# Patient Record
Sex: Female | Born: 1945 | Race: White | Hispanic: No | Smoking: Never smoker
Health system: Southern US, Community
[De-identification: ages and names within clinical notes are randomized; demographics above are authoritative.]

## PROBLEM LIST (undated history)

## (undated) DIAGNOSIS — I10 Essential (primary) hypertension: Secondary | ICD-10-CM

## (undated) HISTORY — PX: REPLACEMENT TOTAL KNEE BILATERAL: SUR1225

## (undated) HISTORY — PX: CATARACT EXTRACTION: SUR2

---

## 2016-08-14 ENCOUNTER — Emergency Department
Admission: EM | Admit: 2016-08-14 | Discharge: 2016-08-14 | Discharge status: Absent without leave | Payer: Medicare Other

## 2016-08-14 ENCOUNTER — Ambulatory Visit (INDEPENDENT_AMBULATORY_CARE_PROVIDER_SITE_OTHER)
Admission: EM | Admit: 2016-08-14 | Discharge: 2016-08-14 | Disposition: A | Discharge status: Home / Self Care | Payer: Medicare Other | Source: Home / Self Care | Attending: Family Medicine | Admitting: Family Medicine

## 2016-08-14 ENCOUNTER — Encounter: Payer: Self-pay | Admitting: Emergency Medicine

## 2016-08-14 ENCOUNTER — Ambulatory Visit: Payer: Medicare Other

## 2016-08-14 DIAGNOSIS — I082 Rheumatic disorders of both aortic and tricuspid valves: Secondary | ICD-10-CM | POA: Diagnosis not present

## 2016-08-14 DIAGNOSIS — I119 Hypertensive heart disease without heart failure: Secondary | ICD-10-CM | POA: Diagnosis not present

## 2016-08-14 DIAGNOSIS — Z91013 Allergy to seafood: Secondary | ICD-10-CM | POA: Insufficient documentation

## 2016-08-14 DIAGNOSIS — Z6841 Body Mass Index (BMI) 40.0 and over, adult: Secondary | ICD-10-CM | POA: Diagnosis not present

## 2016-08-14 DIAGNOSIS — J101 Influenza due to other identified influenza virus with other respiratory manifestations: Principal | ICD-10-CM | POA: Insufficient documentation

## 2016-08-14 DIAGNOSIS — E876 Hypokalemia: Secondary | ICD-10-CM | POA: Insufficient documentation

## 2016-08-14 DIAGNOSIS — M7989 Other specified soft tissue disorders: Secondary | ICD-10-CM | POA: Diagnosis not present

## 2016-08-14 DIAGNOSIS — R0602 Shortness of breath: Secondary | ICD-10-CM | POA: Diagnosis present

## 2016-08-14 DIAGNOSIS — R0902 Hypoxemia: Secondary | ICD-10-CM | POA: Insufficient documentation

## 2016-08-14 DIAGNOSIS — E669 Obesity, unspecified: Secondary | ICD-10-CM | POA: Insufficient documentation

## 2016-08-14 DIAGNOSIS — Z96653 Presence of artificial knee joint, bilateral: Secondary | ICD-10-CM | POA: Diagnosis not present

## 2016-08-14 DIAGNOSIS — R0689 Other abnormalities of breathing: Secondary | ICD-10-CM

## 2016-08-14 DIAGNOSIS — J209 Acute bronchitis, unspecified: Secondary | ICD-10-CM | POA: Insufficient documentation

## 2016-08-14 HISTORY — DX: Essential (primary) hypertension: I10

## 2016-08-14 LAB — URINALYSIS, COMPLETE (UACMP) WITH MICROSCOPIC
Bacteria, UA: NONE SEEN
Bilirubin Urine: NEGATIVE
Glucose, UA: NEGATIVE mg/dL
Ketones, ur: NEGATIVE mg/dL
Leukocytes, UA: NEGATIVE
Nitrite: NEGATIVE
Protein, ur: NEGATIVE mg/dL
SPECIFIC GRAVITY, URINE: 1.02 (ref 1.005–1.030)
WBC UA: NONE SEEN WBC/hpf (ref 0–5)
pH: 5.5 (ref 5.0–8.0)

## 2016-08-14 LAB — CBC WITH DIFFERENTIAL/PLATELET
Basophils Absolute: 0 10*3/uL (ref 0–0.1)
Basophils Relative: 1 %
EOS PCT: 0 %
Eosinophils Absolute: 0 10*3/uL (ref 0–0.7)
HCT: 36.4 % (ref 35.0–47.0)
HEMOGLOBIN: 12.1 g/dL (ref 12.0–16.0)
LYMPHS ABS: 0.6 10*3/uL — AB (ref 1.0–3.6)
LYMPHS PCT: 11 %
MCH: 27.4 pg (ref 26.0–34.0)
MCHC: 33.1 g/dL (ref 32.0–36.0)
MCV: 82.8 fL (ref 80.0–100.0)
Monocytes Absolute: 0.2 10*3/uL (ref 0.2–0.9)
Monocytes Relative: 4 %
Neutro Abs: 5.1 10*3/uL (ref 1.4–6.5)
Neutrophils Relative %: 84 %
PLATELETS: 193 10*3/uL (ref 150–440)
RBC: 4.4 MIL/uL (ref 3.80–5.20)
RDW: 13.9 % (ref 11.5–14.5)
WBC: 6 10*3/uL (ref 3.6–11.0)

## 2016-08-14 LAB — BASIC METABOLIC PANEL
Anion gap: 10 (ref 5–15)
BUN: 15 mg/dL (ref 6–20)
CHLORIDE: 94 mmol/L — AB (ref 101–111)
CO2: 27 mmol/L (ref 22–32)
Calcium: 8.7 mg/dL — ABNORMAL LOW (ref 8.9–10.3)
Creatinine, Ser: 0.69 mg/dL (ref 0.44–1.00)
GFR calc Af Amer: >60 mL/min (ref >60)
GFR calc non Af Amer: >60 mL/min (ref >60)
GLUCOSE: 185 mg/dL — AB (ref 65–99)
POTASSIUM: 3 mmol/L — AB (ref 3.5–5.1)
Sodium: 131 mmol/L — ABNORMAL LOW (ref 135–145)

## 2016-08-14 LAB — RAPID INFLUENZA A&B ANTIGENS (ARMC ONLY)
INFLUENZA A (ARMC): POSITIVE — AB
INFLUENZA B (ARMC): NEGATIVE

## 2016-08-14 LAB — RAPID STREP SCREEN (MED CTR MEBANE ONLY): STREPTOCOCCUS, GROUP A SCREEN (DIRECT): NEGATIVE

## 2016-08-14 MED ORDER — PREDNISONE 50 MG PO TABS
60.0000 mg | ORAL_TABLET | Freq: Once | ORAL | Status: AC
  Administered 2016-08-14: 20:00:00 60 mg via ORAL

## 2016-08-14 MED ORDER — IPRATROPIUM-ALBUTEROL 0.5-2.5 (3) MG/3ML IN SOLN
3.0000 mL | Freq: Four times a day (QID) | RESPIRATORY_TRACT | Status: DC
  Administered 2016-08-14: 3 mL via RESPIRATORY_TRACT

## 2016-08-14 MED ORDER — IPRATROPIUM-ALBUTEROL 0.5-2.5 (3) MG/3ML IN SOLN
3.0000 mL | Freq: Once | RESPIRATORY_TRACT | Status: AC
  Administered 2016-08-14: 3 mL via RESPIRATORY_TRACT

## 2016-08-14 MED ORDER — ACETAMINOPHEN 325 MG PO TABS
650.0000 mg | ORAL_TABLET | Freq: Once | ORAL | Status: AC
  Administered 2016-08-14: 650 mg via ORAL

## 2016-08-14 NOTE — ED Notes (Signed)
Ambulatory O2 sat 84% -86%

## 2016-08-14 NOTE — ED Triage Notes (Signed)
Patient c/o cough, chest congestion, runny nose, bodyaches for the past 3 days.

## 2016-08-14 NOTE — ED Notes (Signed)
Discharged to go directly to emergency dept at [facility].

## 2016-08-14 NOTE — ED Provider Notes (Signed)
[facility] ____________________________________________  Time seen: Approximately 6:40 PM  I have reviewed the triage vital signs and the nursing notes.   HISTORY  Chief Complaint Cough and Generalized Body Aches   HPI Tara Moody is a 71 y.o. female  presenting for the complaints of 2 days of chills, body aches, increased nasal congestion, increased cough and fevers. She reports has intermittently been taking over-the-counter Tylenol, last dose taken this morning with over-the-counter Mucinex. Patient reports her sister recently sick with similar complaints. Denies other known sick contacts. Patient reports that she does have some chronic allergies with postnasal drainage, clearing is an occasional cough and what she has had in the last several weeks, but reports acute change in symptoms over the last 2 days. Reports overall continues to eat and drink well. Reports has never seen that she's urinating more frequently, but denies burning with urination or vaginal complaints. Patient reports cough intermittently waking her up at night, continues throughout the day. States cough is occasionally productive of white phlegm.  Denies chest pain, shortness of breath, abdominal pain, extremity swelling, recent immobilization, recent surgery. Denies cardiac history. Denies renal insufficiency.  No LMP recorded. Patient is postmenopausal.  [place]    Past Medical History:  Diagnosis Date  . Hypertension     There are no active problems to display for this patient.   Past Surgical History:  Procedure Laterality Date  . CATARACT EXTRACTION    . REPLACEMENT TOTAL KNEE BILATERAL      Current Outpatient Rx  . Order #: [id number] Class: Historical Med  . Order #: [id number] Class: Historical Med  . Order #: [id number] Class: Historical Med  . Order #: [id number] Class: Historical Med  . Order #: [id number] Class: Historical Med  . Order #: [id number] Class:  Historical Med  . Order #: [id number] Class: Historical Med    No current facility-administered medications for this encounter.   Current Outpatient Prescriptions:  .  amLODipine (NORVASC) 5 MG tablet, Take 5 mg by mouth daily., Disp: , Rfl:  .  citalopram (CELEXA) 20 MG tablet, Take 20 mg by mouth daily., Disp: , Rfl:  .  fluticasone (FLONASE) 50 MCG/ACT nasal spray, Place 1 spray into both nostrils daily., Disp: , Rfl:  .  loratadine (CLARITIN) 10 MG tablet, Take 10 mg by mouth daily., Disp: , Rfl:  .  losartan (COZAAR) 25 MG tablet, Take 25 mg by mouth daily., Disp: , Rfl:  .  metoprolol (LOPRESSOR) 100 MG tablet, Take 100 mg by mouth 2 (two) times daily., Disp: , Rfl:  .  Multiple Vitamin (MULTIVITAMIN) tablet, Take 1 tablet by mouth daily., Disp: , Rfl:   Allergies Shrimp [shellfish allergy]  History reviewed. No pertinent family history.  Social History Social History  Substance Use Topics  . Smoking status: Never Smoker  . Smokeless tobacco: Never Used  . Alcohol use No    Review of Systems Constitutional: As above.  Eyes: No visual changes. ENT: As above.  Cardiovascular: Denies chest pain. Respiratory: Denies shortness of breath. Gastrointestinal: No abdominal pain.  No nausea, no vomiting.  No diarrhea.  No constipation. Genitourinary: Negative for dysuria. Musculoskeletal: Negative for back pain. Skin: Negative for rash. Neurological: Negative for headaches, focal weakness or numbness.  10-point ROS otherwise negative.  ____________________________________________   PHYSICAL EXAM:  VITAL SIGNS: ED Triage Vitals  Enc Vitals Group     BP 08/14/16 1727 139/60     Pulse Rate 08/14/16 1727 89     Resp 08/14/16 1727  16     Temp 08/14/16 1727 (!) 101.4 F (38.6 C)     Temp Source 08/14/16 1727 Oral     SpO2 08/14/16 1727 95 %     Weight 08/14/16 1728 235 lb (106.6 kg)     Height 08/14/16 1728 5\' 4"  (1.626 m)     Head Circumference --      Peak Flow --       Pain Score 08/14/16 1733 7     Pain Loc --      Pain Edu? --      Excl. in GC? --    Vitals:   08/14/16 1728 08/14/16 1828 08/14/16 1844 08/14/16 2021  BP:    (!) 116/54  Pulse:  (!) 107 (!) 102 (!) 104  Resp:  18 17 20   Temp:   (!) 100.9 F (38.3 C) 99.6 F (37.6 C)  TempSrc:   Oral Oral  SpO2:  94% 91% 92%  Weight: 235 lb (106.6 kg)     Height: 5\' 4"  (1.626 m)       Constitutional: Alert and oriented. Well appearing and in no acute distress. Eyes: Conjunctivae are normal. PERRL. EOMI. Head: Atraumatic. No sinus tenderness to palpation. No swelling. No erythema.  Ears: no erythema, normal TMs bilaterally.   Nose:Nasal congestion with clear rhinorrhea  Mouth/Throat: Mucous membranes are moist. Mild pharyngeal erythema. No tonsillar swelling or exudate.  Neck: No stridor.  No cervical spine tenderness to palpation. Hematological/Lymphatic/Immunilogical: No cervical lymphadenopathy. Cardiovascular: Normal rate, regular rhythm. Grossly normal heart sounds.  Good peripheral circulation. Respiratory:   No retractions. Diffuse inspiratory and expiratory wheezing and scattered rhonchi. Speaks in complete sentences. No focal area of consolidation auscultated.  Gastrointestinal: Soft and nontender. No CVA tenderness. Musculoskeletal: No lower extremity tenderness nor edema. No cervical, thoracic or lumbar tenderness to palpation. Neurologic:  Normal speech and language. No gait instability. Skin:  Skin is warm, dry and intact. No rash noted. Psychiatric: Mood and affect are normal. Speech and behavior are normal.  ___________________________________________   LABS (all labs ordered are listed, but only abnormal results are displayed)  Labs Reviewed  RAPID INFLUENZA A&B ANTIGENS ([facility] ONLY) - Abnormal; Notable for the following:       Result Value   Influenza A ([facility]) POSITIVE (*)    All other components within normal limits  URINALYSIS, COMPLETE (UACMP) WITH MICROSCOPIC -  Abnormal; Notable for the following:    Hgb urine dipstick MODERATE (*)    Squamous Epithelial / LPF 0-5 (*)    All other components within normal limits  RAPID STREP SCREEN (NOT AT [facility])  CULTURE, GROUP A STREP [facility])   ____________________________________________  EKG  ED ECG REPORT I, Renford Dills, the attending provider, personally viewed and interpreted this ECG.   Date: 08/14/2016  EKG Time: 2017  Rate: 104  Rhythm:sinus tachycardia  Axis: normal   Intervals:right bundle branch block incomplete  ST&T Change: none  ____________________________________________  RADIOLOGY  Dg Chest 2 View  Result Date: 08/14/2016 CLINICAL DATA:  Cough, congestion and fever for 3 days. History of hypertension. EXAM: CHEST  2 VIEW COMPARISON:  None. FINDINGS: Cardiac silhouette is mildly enlarged. Mediastinal silhouette is nonsuspicious. No pleural effusion or focal consolidation. No pneumothorax. Moderate degenerative change of the thoracic spine. Thoracolumbar levoscoliosis. Soft tissue planes are nonsuspicious. IMPRESSION: Mild cardiomegaly.  No acute pulmonary process. Electronically Signed   By: Awilda Metro M.D.   On: 08/14/2016 18:53   ____________________________________________   PROCEDURES Procedures  INITIAL IMPRESSION / ASSESSMENT AND PLAN / ED COURSE  Pertinent labs & imaging results that were available during my care of the patient were reviewed by me and considered in my medical decision making (see chart for details).  Patient reports history of some chronic allergies with frequent nasal congestion and rhinorrhea that has been present for her, similarly to her baseline, for the last week or 2. Patient reports acute change over the last 2 days including cough, chills, body aches and increased nasal congestion.  Patient with diffuse inspiratory and expiratory wheezing and scattered rhonchi. Will evaluate influenza, strep, chest x-ray and urinalysis. DuoNeb 1 given in  urgent care. Prednisone is an 60 mg orally given once. 650 mg oral Tylenol also given.  After initial DuoNeb, patient reexamined oxygenation at rest 91%, wheezes slightly improved. Chest x-ray per radiologist cardiomegaly without acute pulmonary process. Influenza A+. Quick strep negative, will culture. RN ambulated patient and monitored oxygenation. Ambulatory O2 sat per Herbert Seta RN, ranging from 84-86%. A second DuoNeb administered. Patient states no shortness of breath, however patient visibly appear short of breath.  After second DuoNeb, wheezes improved with mild scattered wheezing remaining. Good air movement. Patient denies shortness of breath. Resting oxygenation 94%. Ambulatory O2 sat 87%. Discussed with detail with patient to hypoxemia with ambulation recommended patient be further monitored and evaluated in emergency room of her choice. Patient alert and oriented with decisional capacity and states that she does not want to go by EMS, but will have her brother drive her. Patient stable at the time of discharge. Patient states that she'll be going to [facility]. Misty Stanley RN triage nurse called and report given.   Patient verbalized understanding and agreed to plan.   ____________________________________________   FINAL CLINICAL IMPRESSION(S) / ED DIAGNOSES  Final diagnoses:  Influenza A  Impaired oxygenation     Discharge Medication List as of 08/14/2016  8:08 PM      Note: This dictation was prepared with Dragon dictation along with smaller phrase technology. Any transcriptional errors that result from this process are unintentional.    Clinical Course       Renford Dills, NP 08/14/16 2229

## 2016-08-14 NOTE — ED Triage Notes (Signed)
Patient to ER for c/o influenza (tested positive today at urgent care). Patient's sats continued to drop at urgent care, particularly with ambulation. Patient's states sats dropped into 80's with ambulation. Patient appears fatigued. Able to walk to second triage room without much difficulty, but visibly more short of breath than with sitting.

## 2016-08-14 NOTE — ED Notes (Addendum)
Patient received flu swab, strep swab, EKG, CXR and urinalysis at urgent care.

## 2016-08-14 NOTE — Discharge Instructions (Signed)
Go directly to Emergency room as discussed.  °

## 2016-08-15 ENCOUNTER — Observation Stay
Admit: 2016-08-15 | Discharge: 2016-08-15 | Disposition: A | Discharge status: Home / Self Care | Payer: Medicare Other | Attending: Internal Medicine | Admitting: Internal Medicine

## 2016-08-15 ENCOUNTER — Observation Stay
Admission: EM | Admit: 2016-08-15 | Discharge: 2016-08-16 | Disposition: A | Discharge status: Home / Self Care | Payer: Medicare Other | Attending: Internal Medicine | Admitting: Internal Medicine

## 2016-08-15 ENCOUNTER — Observation Stay: Payer: Medicare Other

## 2016-08-15 DIAGNOSIS — J111 Influenza due to unidentified influenza virus with other respiratory manifestations: Secondary | ICD-10-CM

## 2016-08-15 DIAGNOSIS — R0902 Hypoxemia: Secondary | ICD-10-CM

## 2016-08-15 DIAGNOSIS — J9801 Acute bronchospasm: Secondary | ICD-10-CM

## 2016-08-15 DIAGNOSIS — J101 Influenza due to other identified influenza virus with other respiratory manifestations: Secondary | ICD-10-CM | POA: Diagnosis not present

## 2016-08-15 DIAGNOSIS — R0602 Shortness of breath: Secondary | ICD-10-CM

## 2016-08-15 DIAGNOSIS — M7989 Other specified soft tissue disorders: Secondary | ICD-10-CM

## 2016-08-15 LAB — BLOOD GAS, VENOUS
Acid-Base Excess: 7.2 mmol/L — ABNORMAL HIGH (ref 0.0–2.0)
Bicarbonate: 33.4 mmol/L — ABNORMAL HIGH (ref 20.0–28.0)
O2 SAT: 72.1 %
PATIENT TEMPERATURE: 37
pCO2, Ven: 54 mmHg (ref 44.0–60.0)
pH, Ven: 7.4 (ref 7.250–7.430)
pO2, Ven: 38 mmHg (ref 32.0–45.0)

## 2016-08-15 LAB — TROPONIN I

## 2016-08-15 LAB — BRAIN NATRIURETIC PEPTIDE: B Natriuretic Peptide: 67 pg/mL (ref 0.0–100.0)

## 2016-08-15 MED ORDER — FUROSEMIDE 10 MG/ML IJ SOLN
20.0000 mg | Freq: Once | INTRAMUSCULAR | Status: AC
  Administered 2016-08-15: 20 mg via INTRAVENOUS
  Filled 2016-08-15: qty 2

## 2016-08-15 MED ORDER — FUROSEMIDE 10 MG/ML IJ SOLN
INTRAMUSCULAR | Status: AC
  Administered 2016-08-15: 20 mg via INTRAVENOUS
  Filled 2016-08-15: qty 4

## 2016-08-15 MED ORDER — POTASSIUM CHLORIDE CRYS ER 20 MEQ PO TBCR
40.0000 meq | EXTENDED_RELEASE_TABLET | Freq: Two times a day (BID) | ORAL | Status: DC
  Administered 2016-08-15 – 2016-08-16 (×3): 40 meq via ORAL
  Filled 2016-08-15 (×5): qty 2

## 2016-08-15 MED ORDER — ACETAMINOPHEN 325 MG PO TABS
650.0000 mg | ORAL_TABLET | Freq: Four times a day (QID) | ORAL | Status: DC | PRN
  Administered 2016-08-15: 650 mg via ORAL
  Filled 2016-08-15: qty 2

## 2016-08-15 MED ORDER — LORATADINE 10 MG PO TABS
10.0000 mg | ORAL_TABLET | Freq: Every day | ORAL | Status: DC
Start: 2016-08-15 — End: 2016-08-16
  Administered 2016-08-15 – 2016-08-16 (×2): 10 mg via ORAL
  Filled 2016-08-15: qty 1

## 2016-08-15 MED ORDER — LEVOFLOXACIN 500 MG PO TABS
500.0000 mg | ORAL_TABLET | Freq: Every day | ORAL | Status: DC
  Administered 2016-08-15 – 2016-08-16 (×2): 500 mg via ORAL
  Filled 2016-08-15: qty 1

## 2016-08-15 MED ORDER — ADULT MULTIVITAMIN W/MINERALS CH
1.0000 | ORAL_TABLET | Freq: Every day | ORAL | Status: DC
  Administered 2016-08-15 – 2016-08-16 (×2): 1 via ORAL
  Filled 2016-08-15: qty 1

## 2016-08-15 MED ORDER — OSELTAMIVIR PHOSPHATE 75 MG PO CAPS
75.0000 mg | ORAL_CAPSULE | Freq: Two times a day (BID) | ORAL | Status: DC
  Administered 2016-08-15 – 2016-08-16 (×2): 75 mg via ORAL
  Filled 2016-08-15 (×4): qty 1

## 2016-08-15 MED ORDER — MAGNESIUM SULFATE 2 GM/50ML IV SOLN
2.0000 g | Freq: Once | INTRAVENOUS | Status: AC
  Administered 2016-08-15: 2 g via INTRAVENOUS
  Filled 2016-08-15: qty 50

## 2016-08-15 MED ORDER — METOPROLOL TARTRATE 50 MG PO TABS
ORAL_TABLET | ORAL | Status: AC
  Administered 2016-08-15: 100 mg via ORAL
  Filled 2016-08-15: qty 2

## 2016-08-15 MED ORDER — ACETAMINOPHEN 650 MG RE SUPP
650.0000 mg | Freq: Four times a day (QID) | RECTAL | Status: DC | PRN
  Filled 2016-08-15: qty 1

## 2016-08-15 MED ORDER — SODIUM CHLORIDE 0.9 % IV SOLN
INTRAVENOUS | Status: DC
  Administered 2016-08-15 – 2016-08-16 (×2): via INTRAVENOUS

## 2016-08-15 MED ORDER — HEPARIN SODIUM (PORCINE) 5000 UNIT/ML IJ SOLN
5000.0000 [IU] | Freq: Three times a day (TID) | INTRAMUSCULAR | Status: DC
  Administered 2016-08-15 – 2016-08-16 (×3): 5000 [IU] via SUBCUTANEOUS
  Filled 2016-08-15 (×2): qty 1

## 2016-08-15 MED ORDER — LOSARTAN POTASSIUM 25 MG PO TABS
25.0000 mg | ORAL_TABLET | Freq: Every day | ORAL | Status: DC
  Administered 2016-08-15 – 2016-08-16 (×2): 25 mg via ORAL
  Filled 2016-08-15: qty 1

## 2016-08-15 MED ORDER — DOCUSATE SODIUM 100 MG PO CAPS
ORAL_CAPSULE | ORAL | Status: AC
  Administered 2016-08-15: 100 mg via ORAL
  Filled 2016-08-15: qty 1

## 2016-08-15 MED ORDER — ONDANSETRON HCL 4 MG/2ML IJ SOLN: 4.0000 mg | Freq: Four times a day (QID) | INTRAMUSCULAR | Status: DC | PRN

## 2016-08-15 MED ORDER — METOPROLOL TARTRATE 50 MG PO TABS
100.0000 mg | ORAL_TABLET | Freq: Two times a day (BID) | ORAL | Status: DC
  Administered 2016-08-15 – 2016-08-16 (×3): 100 mg via ORAL
  Filled 2016-08-15 (×2): qty 2

## 2016-08-15 MED ORDER — HEPARIN SODIUM (PORCINE) 5000 UNIT/ML IJ SOLN
INTRAMUSCULAR | Status: AC
  Administered 2016-08-15: 5000 [IU] via SUBCUTANEOUS
  Filled 2016-08-15: qty 1

## 2016-08-15 MED ORDER — CITALOPRAM HYDROBROMIDE 20 MG PO TABS
ORAL_TABLET | ORAL | Status: AC
  Administered 2016-08-15: 20 mg via ORAL
  Filled 2016-08-15: qty 1

## 2016-08-15 MED ORDER — FLUTICASONE PROPIONATE 50 MCG/ACT NA SUSP: 1.0000 | Freq: Every day | NASAL | Status: DC

## 2016-08-15 MED ORDER — DOCUSATE SODIUM 100 MG PO CAPS
100.0000 mg | ORAL_CAPSULE | Freq: Two times a day (BID) | ORAL | Status: DC
  Administered 2016-08-15 – 2016-08-16 (×3): 100 mg via ORAL
  Filled 2016-08-15 (×2): qty 1

## 2016-08-15 MED ORDER — AMLODIPINE BESYLATE 5 MG PO TABS
ORAL_TABLET | ORAL | Status: AC
  Administered 2016-08-15: 5 mg via ORAL
  Filled 2016-08-15: qty 1

## 2016-08-15 MED ORDER — ONDANSETRON HCL 4 MG PO TABS
4.0000 mg | ORAL_TABLET | Freq: Four times a day (QID) | ORAL | Status: DC | PRN
  Filled 2016-08-15: qty 1

## 2016-08-15 MED ORDER — LOSARTAN POTASSIUM 50 MG PO TABS
ORAL_TABLET | ORAL | Status: AC
  Administered 2016-08-15: 25 mg via ORAL
  Filled 2016-08-15: qty 1

## 2016-08-15 MED ORDER — BISACODYL 5 MG PO TBEC
5.0000 mg | DELAYED_RELEASE_TABLET | Freq: Every day | ORAL | Status: DC | PRN
  Filled 2016-08-15: qty 1

## 2016-08-15 MED ORDER — LEVOFLOXACIN 500 MG PO TABS
ORAL_TABLET | ORAL | Status: AC
  Administered 2016-08-15: 500 mg via ORAL
  Filled 2016-08-15: qty 1

## 2016-08-15 MED ORDER — CITALOPRAM HYDROBROMIDE 20 MG PO TABS
20.0000 mg | ORAL_TABLET | Freq: Every day | ORAL | Status: DC
  Administered 2016-08-15 – 2016-08-16 (×2): 20 mg via ORAL
  Filled 2016-08-15: qty 1

## 2016-08-15 MED ORDER — IBUPROFEN 400 MG PO TABS: 400.0000 mg | ORAL_TABLET | Freq: Four times a day (QID) | ORAL | Status: DC | PRN

## 2016-08-15 MED ORDER — LORATADINE 10 MG PO TABS
ORAL_TABLET | ORAL | Status: AC
  Administered 2016-08-15: 10 mg via ORAL
  Filled 2016-08-15: qty 1

## 2016-08-15 MED ORDER — HYDROCOD POLST-CPM POLST ER 10-8 MG/5ML PO SUER
5.0000 mL | Freq: Once | ORAL | Status: AC
  Administered 2016-08-15: 5 mL via ORAL
  Filled 2016-08-15: qty 5

## 2016-08-15 MED ORDER — AMLODIPINE BESYLATE 5 MG PO TABS
5.0000 mg | ORAL_TABLET | Freq: Every day | ORAL | Status: DC
  Administered 2016-08-15 – 2016-08-16 (×2): 5 mg via ORAL
  Filled 2016-08-15: qty 1

## 2016-08-15 MED ORDER — TRAZODONE HCL 50 MG PO TABS: 25.0000 mg | ORAL_TABLET | Freq: Every evening | ORAL | Status: DC | PRN

## 2016-08-15 MED ORDER — ALBUTEROL SULFATE (2.5 MG/3ML) 0.083% IN NEBU
2.5000 mg | INHALATION_SOLUTION | Freq: Once | RESPIRATORY_TRACT | Status: AC
  Administered 2016-08-15: 2.5 mg via RESPIRATORY_TRACT
  Filled 2016-08-15: qty 3

## 2016-08-15 MED ORDER — ADULT MULTIVITAMIN W/MINERALS CH
ORAL_TABLET | ORAL | Status: AC
  Administered 2016-08-15: 1 via ORAL
  Filled 2016-08-15: qty 1

## 2016-08-15 NOTE — ED Provider Notes (Signed)
[facility] Emergency Department Provider Note   ____________________________________________   First MD Initiated Contact with Patient 08/15/16 [phone]     (approximate)  I have reviewed the triage vital signs and the nursing notes.   HISTORY  Chief Complaint Influenza    HPI Elmon ElseBarbara Velarde is a 71 y.o. female who comes into the hospital today with some body aches and shortness of breath. The patient went to urgent care because she had been having some aches and feeling hot with some trembling. The patient has had some coughs and some sweats. She was told at urgent care that she had the flu. She had an x-ray which did not show pneumonia and was given Tylenol. The patient reports that she has been taking Tylenol for 3 days but had not had any improvement in his symptoms. While she was at urgent care they noticed that her oxygen levels were low. She was given some breathing treatments as well as prednisone but her oxygen levels remained low so she was told to come here. The patient has a sore throat and some cough with burning in her chest. The patient rates her pain 8 out of 10 in intensity.   Past Medical History:  Diagnosis Date  . Hypertension     Patient Active Problem List   Diagnosis Date Noted  . Influenza A 08/15/2016    Past Surgical History:  Procedure Laterality Date  . CATARACT EXTRACTION    . REPLACEMENT TOTAL KNEE BILATERAL      Prior to Admission medications   Medication Sig Start Date End Date Taking? Authorizing Provider  acetaminophen (TYLENOL) 325 MG tablet Take 650 mg by mouth every 4 (four) hours as needed.   Yes Historical Provider, MD  amLODipine (NORVASC) 5 MG tablet Take 5 mg by mouth daily.   Yes Historical Provider, MD  citalopram (CELEXA) 20 MG tablet Take 20 mg by mouth daily.   Yes Historical Provider, MD  fluticasone (FLONASE) 50 MCG/ACT nasal spray Place 1 spray into both nostrils daily.   Yes Historical Provider, MD    guaiFENesin (MUCINEX) 600 MG 12 hr tablet Take 600 mg by mouth 2 (two) times daily.   Yes Historical Provider, MD  loratadine (CLARITIN) 10 MG tablet Take 10 mg by mouth daily.   Yes Historical Provider, MD  losartan-hydrochlorothiazide (HYZAAR) 100-25 MG tablet Take 1 tablet by mouth daily.   Yes Historical Provider, MD  metoprolol (LOPRESSOR) 100 MG tablet Take 100 mg by mouth 2 (two) times daily.   Yes Historical Provider, MD  Multiple Vitamin (MULTIVITAMIN) tablet Take 1 tablet by mouth daily.   Yes Historical Provider, MD    Allergies Shrimp [shellfish allergy]  No family history on file.  Social History Social History  Substance Use Topics  . Smoking status: Never Smoker  . Smokeless tobacco: Never Used  . Alcohol use No    Review of Systems Constitutional: fever/chills Eyes: No visual changes. ENT: No sore throat. Cardiovascular:  chest pain. Respiratory:  Cough and shortness of breath. Gastrointestinal: No abdominal pain.  No nausea, no vomiting.  No diarrhea.  No constipation. Genitourinary: Negative for dysuria. Musculoskeletal: Body aches Skin: Negative for rash. Neurological: Negative for headaches, focal weakness or numbness.  10-point ROS otherwise negative.  ____________________________________________   PHYSICAL EXAM:  VITAL SIGNS: ED Triage Vitals  Enc Vitals Group     BP 08/14/16 2209 (!) 146/114     Pulse Rate 08/14/16 2209 98     Resp 08/14/16  2209 20     Temp 08/14/16 2209 98.7 F (37.1 C)     Temp Source 08/14/16 2209 Oral     SpO2 08/14/16 2209 94 %     Weight 08/14/16 2212 235 lb (106.6 kg)     Height 08/14/16 2212 5\' 4"  (1.626 m)     Head Circumference --      Peak Flow --      Pain Score 08/14/16 2212 9     Pain Loc --      Pain Edu? --      Excl. in GC? --     Constitutional: Alert and oriented. Well appearing and in mild distress. Eyes: Conjunctivae are normal. PERRL. EOMI. Head: Atraumatic. Nose: No  congestion/rhinnorhea. Mouth/Throat: Mucous membranes are moist.  Oropharynx non-erythematous. Cardiovascular: Normal rate, regular rhythm. Grossly normal heart sounds.  Good peripheral circulation. Respiratory: Normal respiratory effort.  No retractions. Expiratory wheezes throughout all lung fields Gastrointestinal: Soft and nontender. No distention. Positive bowel sounds Musculoskeletal: No lower extremity tenderness nor edema.  . Neurologic:  Normal speech and language.  Skin:  Skin is warm, dry and intact. Marland Kitchen Psychiatric: Mood and affect are normal.   ____________________________________________   LABS (all labs ordered are listed, but only abnormal results are displayed)  Labs Reviewed  CBC WITH DIFFERENTIAL/PLATELET - Abnormal; Notable for the following:       Result Value   Lymphs Abs 0.6 (*)    All other components within normal limits  BASIC METABOLIC PANEL - Abnormal; Notable for the following:    Sodium 131 (*)    Potassium 3.0 (*)    Chloride 94 (*)    Glucose, Bld 185 (*)    Calcium 8.7 (*)    All other components within normal limits  BLOOD GAS, VENOUS - Abnormal; Notable for the following:    Bicarbonate 33.4 (*)    Acid-Base Excess 7.2 (*)    All other components within normal limits  CULTURE, BLOOD (ROUTINE X 2)  CULTURE, BLOOD (ROUTINE X 2)  TROPONIN I   ____________________________________________  EKG  none  ____________________________________________  RADIOLOGY  none ____________________________________________   PROCEDURES  Procedure(s) performed: None  Procedures  Critical Care performed: No  ____________________________________________   INITIAL IMPRESSION / ASSESSMENT AND PLAN / ED COURSE  Pertinent labs & imaging results that were available during my care of the patient were reviewed by me and considered in my medical decision making (see chart for details).  This is a 71 year old female who comes into the hospital today  after being at urgent care. The patient was diagnosed with the flu but she does have some wheezing and hypoxia. The patient had received some prednisone already at urgent care as well as to duo nebs. I did give the patient some magnesium sulfate as well as an albuterol treatment. The patient also received some Tussionex to help with her cough. I will reassess the patient.  Clinical Course    I did go back into check on the patient and she was hypoxic into the high 80s. The patient's oxygen levels was 84-87%. I placed the patient on 3 L of O2 by nasal cannula and I will admit her to the hospitalist service. The patient receive another albuterol neb and will be admitted. I also added on a troponin and a blood culture to the patient's blood work.  ____________________________________________   FINAL CLINICAL IMPRESSION(S) / ED DIAGNOSES  Final diagnoses:  Influenza  Hypoxia  Shortness of breath  Bronchospasm  NEW MEDICATIONS STARTED DURING THIS VISIT:  New Prescriptions   No medications on file     Note:  This document was prepared using Dragon voice recognition software and may include unintentional dictation errors.    Rebecka Apley, MD 08/15/16 [phone]

## 2016-08-15 NOTE — ED Notes (Signed)
Pt to ultrasound

## 2016-08-15 NOTE — H&P (Signed)
[facility] [place] at [facility]   PATIENT NAME: Tara ElseBarbara Moody    MR#:  [id number]  DATE OF BIRTH:  11/09/1945  DATE OF ADMISSION:  08/15/2016  PRIMARY CARE PHYSICIAN: No PCP Per Patient   REQUESTING/REFERRING PHYSICIAN:   CHIEF COMPLAINT:   Chief Complaint  Patient presents with  . Influenza    HISTORY OF PRESENT ILLNESS: Tara Moody  is a 71 y.o. female with a known history of Hypertension, arthritis, irritable bowel syndrome, diverticulosis, depression, who presents to the hospital with complaints of 4 day history of fevers, chills, soreness in the throat, sinus congestion, weakness, shortness of breath. She presented to urgent care where she was noted to have influenza A, she was given breathing treatments, prednisone, Tylenol, and sent to emergency room for admission. Since her oxygen levels were low. On arrival to the hospital, hospitalist services were contacted and patient was admitted. Chest x-ray revealed no pneumonia. Patient admits of having lower extremity swelling and shortness of breath, yellow sputum for the past 4 days, chills and fevers for 4 days, wheezing.  PAST MEDICAL HISTORY:   Past Medical History:  Diagnosis Date  . Hypertension     PAST SURGICAL HISTORY: Past Surgical History:  Procedure Laterality Date  . CATARACT EXTRACTION    . REPLACEMENT TOTAL KNEE BILATERAL      SOCIAL HISTORY:  Social History  Substance Use Topics  . Smoking status: Never Smoker  . Smokeless tobacco: Never Used  . Alcohol use No    FAMILY HISTORY: No family history on file.  DRUG ALLERGIES:  Allergies  Allergen Reactions  . Shrimp [Shellfish Allergy] Anaphylaxis    Review of Systems  Constitutional: Negative for chills, fever and weight loss.  HENT: Positive for congestion, sinus pain and sore throat.   Eyes: Negative for blurred vision and double vision.  Respiratory: Positive for cough, sputum production and shortness of breath.  Negative for wheezing.   Cardiovascular: Positive for palpitations and leg swelling. Negative for chest pain, orthopnea and PND.  Gastrointestinal: Positive for constipation. Negative for abdominal pain, blood in stool, diarrhea, nausea and vomiting.  Genitourinary: Negative for dysuria, frequency, hematuria and urgency.  Musculoskeletal: Positive for joint pain. Negative for falls.  Neurological: Positive for weakness. Negative for dizziness, tremors, focal weakness and headaches.  Endo/Heme/Allergies: Does not bruise/bleed easily.  Psychiatric/Behavioral: Negative for depression. The patient does not have insomnia.     MEDICATIONS AT HOME:  Prior to Admission medications   Medication Sig Start Date End Date Taking? Authorizing Provider  acetaminophen (TYLENOL) 325 MG tablet Take 650 mg by mouth every 4 (four) hours as needed.   Yes Historical Provider, MD  amLODipine (NORVASC) 5 MG tablet Take 5 mg by mouth daily.   Yes Historical Provider, MD  citalopram (CELEXA) 20 MG tablet Take 20 mg by mouth daily.   Yes Historical Provider, MD  fluticasone (FLONASE) 50 MCG/ACT nasal spray Place 1 spray into both nostrils daily.   Yes Historical Provider, MD  guaiFENesin (MUCINEX) 600 MG 12 hr tablet Take 600 mg by mouth 2 (two) times daily.   Yes Historical Provider, MD  loratadine (CLARITIN) 10 MG tablet Take 10 mg by mouth daily.   Yes Historical Provider, MD  losartan-hydrochlorothiazide (HYZAAR) 100-25 MG tablet Take 1 tablet by mouth daily.   Yes Historical Provider, MD  metoprolol (LOPRESSOR) 100 MG tablet Take 100 mg by mouth 2 (two) times daily.   Yes Historical Provider, MD  Multiple Vitamin (MULTIVITAMIN) tablet  Take 1 tablet by mouth daily.   Yes Historical Provider, MD      PHYSICAL EXAMINATION:   VITAL SIGNS: Blood pressure 123/62, pulse 97, temperature 98.7 F (37.1 C), temperature source Oral, resp. rate 19, height 5\' 4"  (1.626 m), weight 106.6 kg (235 lb), SpO2 96 %.  GENERAL:   71 y.o.-year-old patient lying in the bed with no acute distress.  EYES: Pupils equal, round, reactive to light and accommodation. No scleral icterus. Extraocular muscles intact.  HEENT: Head atraumatic, normocephalic. Oropharynx and nasopharynx clear.  NECK:  Supple, no jugular venous distention. No thyroid enlargement, no tenderness.  LUNGS: Normal breath sounds bilaterally, no wheezing,But bilateral scattered rales,rhonchi and crepitations noted bilateral lung fields posteriorly. Intermittent use of accessory muscles of respiration.  CARDIOVASCULAR: S1, S2 normal. 3/6 systolic murmur was heard in martial auscultation side, no rubs, or gallops.  ABDOMEN: Soft, nontender, nondistended. Bowel sounds present. No organomegaly or mass.  EXTREMITIES: 1-2+ lower extremity and pedal edema, no cyanosis, or clubbing.  NEUROLOGIC: Cranial nerves II through XII are intact. Muscle strength 5/5 in all extremities. Sensation intact. Gait not checked.  PSYCHIATRIC: The patient is alert and oriented x 3.  SKIN: No obvious rash, lesion, or ulcer.   LABORATORY PANEL:   CBC  Recent Labs Lab 08/14/16 2215  WBC 6.0  HGB 12.1  HCT 36.4  PLT 193  MCV 82.8  MCH 27.4  MCHC 33.1  RDW 13.9  LYMPHSABS 0.6*  MONOABS 0.2  EOSABS 0.0  BASOSABS 0.0   ------------------------------------------------------------------------------------------------------------------  Chemistries   Recent Labs Lab 08/14/16 2215  NA 131*  K 3.0*  CL 94*  CO2 27  GLUCOSE 185*  BUN 15  CREATININE 0.69  CALCIUM 8.7*   ------------------------------------------------------------------------------------------------------------------  Cardiac Enzymes  Recent Labs Lab 08/15/16 0729  TROPONINI <0.03   ------------------------------------------------------------------------------------------------------------------  RADIOLOGY: Dg Chest 2 View  Result Date: 08/14/2016 CLINICAL DATA:  Cough, congestion and fever for 3  days. History of hypertension. EXAM: CHEST  2 VIEW COMPARISON:  None. FINDINGS: Cardiac silhouette is mildly enlarged. Mediastinal silhouette is nonsuspicious. No pleural effusion or focal consolidation. No pneumothorax. Moderate degenerative change of the thoracic spine. Thoracolumbar levoscoliosis. Soft tissue planes are nonsuspicious. IMPRESSION: Mild cardiomegaly.  No acute pulmonary process. Electronically Signed   By: Awilda Metro M.D.   On: 08/14/2016 18:53    EKG: Orders placed or performed during the hospital encounter of 08/15/16  . ED EKG  . ED EKG  . EKG 12-Lead  . EKG 12-Lead    IMPRESSION AND PLAN:  Active Problems:   Influenza A  #1. Influenza type A, initiate patient on Tamiflu, isolation #2. Acute bronchitis, initiate levofloxacin orally, get sputum cultures if possible #3. Dyspnea and hypoxia of 89% on room air of unclear etiology at this time, get BNP. I'm concerned about congestive heart failure, get echocardiogram and give her 1 dose of Lasix, continue oxygen therapy, wean off as tolerated #4. Hypokalemia, supplementing orally, recheck in the morning #5. Lower extremity swelling,.the Dopplers to rule out DVT   All the records are reviewed and case discussed with ED provider. Management plans discussed with the patient, family and they are in agreement.  CODE STATUS: Code Status History    This patient does not have a recorded code status. Please follow your organizational policy for patients in this situation.       TOTAL TIME TAKING CARE OF THIS PATIENT: 55 minutes.    Katharina Caper M.D on 08/15/2016 at 11:06 AM  Between 7am  to 6pm - Pager - [phone] After 6pm go to [email or web address] - password [email or web address] [facility]  [place] [facility]  Office  [phone]  CC: Primary care physician; No PCP Per Patient

## 2016-08-15 NOTE — ED Notes (Signed)
Pt up to bathroom.

## 2016-08-15 NOTE — ED Notes (Signed)
Pt on droplet precautions. Admitting MD at bedside.

## 2016-08-16 DIAGNOSIS — J101 Influenza due to other identified influenza virus with other respiratory manifestations: Secondary | ICD-10-CM | POA: Diagnosis not present

## 2016-08-16 LAB — CBC
HCT: 38.6 % (ref 35.0–47.0)
HEMOGLOBIN: 12.6 g/dL (ref 12.0–16.0)
MCH: 27.3 pg (ref 26.0–34.0)
MCHC: 32.6 g/dL (ref 32.0–36.0)
MCV: 83.7 fL (ref 80.0–100.0)
PLATELETS: 212 10*3/uL (ref 150–440)
RBC: 4.61 MIL/uL (ref 3.80–5.20)
RDW: 14.6 % — ABNORMAL HIGH (ref 11.5–14.5)
WBC: 5.8 10*3/uL (ref 3.6–11.0)

## 2016-08-16 LAB — MRSA PCR SCREENING: MRSA BY PCR: NEGATIVE

## 2016-08-16 LAB — EXPECTORATED SPUTUM ASSESSMENT W GRAM STAIN, RFLX TO RESP C

## 2016-08-16 LAB — BASIC METABOLIC PANEL
ANION GAP: 9 (ref 5–15)
BUN: 15 mg/dL (ref 6–20)
CALCIUM: 8.6 mg/dL — AB (ref 8.9–10.3)
CO2: 29 mmol/L (ref 22–32)
CREATININE: 0.48 mg/dL (ref 0.44–1.00)
Chloride: 98 mmol/L — ABNORMAL LOW (ref 101–111)
Glucose, Bld: 101 mg/dL — ABNORMAL HIGH (ref 65–99)
Potassium: 4.1 mmol/L (ref 3.5–5.1)
SODIUM: 136 mmol/L (ref 135–145)

## 2016-08-16 LAB — ECHOCARDIOGRAM COMPLETE

## 2016-08-16 LAB — GLUCOSE, CAPILLARY: Glucose-Capillary: 95 mg/dL (ref 65–99)

## 2016-08-16 LAB — EXPECTORATED SPUTUM ASSESSMENT W REFEX TO RESP CULTURE

## 2016-08-16 MED ORDER — OSELTAMIVIR PHOSPHATE 75 MG PO CAPS: 75.0000 mg | ORAL_CAPSULE | Freq: Two times a day (BID) | ORAL | 0 refills | Status: AC

## 2016-08-16 NOTE — Progress Notes (Signed)
CH responded to an OR for an AD. Pt was in bed, awake and alert. CH educated Pt on the AD. Pt wants time to look it over before completion. CH is available for follow up as needed.    08/16/16 1000  Clinical Encounter Type  Visited With Patient  Visit Type Initial;Spiritual support  Referral From Nurse  Spiritual Encounters  Spiritual Needs Literature

## 2016-08-16 NOTE — Discharge Instructions (Signed)

## 2016-08-17 LAB — CULTURE, GROUP A STREP (THRC)

## 2016-08-17 NOTE — Discharge Summary (Signed)
[facility] - [place] at [facility]   PATIENT NAME: Tara Moody    MR#:  [id number]  DATE OF BIRTH:  [birth year]  DATE OF ADMISSION:  08/15/2016   ADMITTING PHYSICIAN: Delfino Lovett, MD  DATE OF DISCHARGE: 08/16/2016  2:26 PM  PRIMARY CARE PHYSICIAN: No PCP Per Patient   ADMISSION DIAGNOSIS:  Shortness of breath [R06.02] Bronchospasm [J98.01] Leg swelling [M79.89] Hypoxia [R09.02] Influenza [J11.1] DISCHARGE DIAGNOSIS:  Active Problems:   Influenza A  SECONDARY DIAGNOSIS:   Past Medical History:  Diagnosis Date  . Hypertension    HOSPITAL COURSE:  71 y.o. female with a known history of Hypertension, arthritis, irritable bowel syndrome, diverticulosis, depression, who admitted with complaints of 4 day history of fevers, chills, soreness in the throat, sinus congestion, weakness, shortness of breath  #1. Influenza type A: improving on Tamiflu. #2. Acute bronchitis: symptoms mainly due to flu. Improving. #3. Dyspnea and transient hypoxia: due to flu. improved #4. Hypokalemia: repleted  DISCHARGE CONDITIONS:  stable CONSULTS OBTAINED:   DRUG ALLERGIES:   Allergies  Allergen Reactions  . Shrimp [Shellfish Allergy] Anaphylaxis   DISCHARGE MEDICATIONS:   Allergies as of 08/16/2016      Reactions   Shrimp [shellfish Allergy] Anaphylaxis      Medication List    TAKE these medications   acetaminophen 325 MG tablet Commonly known as:  TYLENOL Take 650 mg by mouth every 4 (four) hours as needed.   amLODipine 5 MG tablet Commonly known as:  NORVASC Take 5 mg by mouth daily.   citalopram 20 MG tablet Commonly known as:  CELEXA Take 20 mg by mouth daily.   fluticasone 50 MCG/ACT nasal spray Commonly known as:  FLONASE Place 1 spray into both nostrils daily.   guaiFENesin 600 MG 12 hr tablet Commonly known as:  MUCINEX Take 600 mg by mouth 2 (two) times daily.   loratadine 10 MG tablet Commonly known as:  CLARITIN Take 10 mg by mouth  daily.   losartan-hydrochlorothiazide 100-25 MG tablet Commonly known as:  HYZAAR Take 1 tablet by mouth daily.   metoprolol 100 MG tablet Commonly known as:  LOPRESSOR Take 100 mg by mouth 2 (two) times daily.   multivitamin tablet Take 1 tablet by mouth daily.   oseltamivir 75 MG capsule Commonly known as:  TAMIFLU Take 1 capsule (75 mg total) by mouth 2 (two) times daily.      DISCHARGE INSTRUCTIONS:   DIET:  Regular diet DISCHARGE CONDITION:  Good ACTIVITY:  Activity as tolerated OXYGEN:  Home Oxygen: No.  Oxygen Delivery: room air DISCHARGE LOCATION:  home   If you experience worsening of your admission symptoms, develop shortness of breath, life threatening emergency, suicidal or homicidal thoughts you must seek medical attention immediately by calling 911 or calling your MD immediately  if symptoms less severe.  You Must read complete instructions/literature along with all the possible adverse reactions/side effects for all the Medicines you take and that have been prescribed to you. Take any new Medicines after you have completely understood and accpet all the possible adverse reactions/side effects.   Please note  You were cared for by a hospitalist during your hospital stay. If you have any questions about your discharge medications or the care you received while you were in the hospital after you are discharged, you can call the unit and asked to speak with the hospitalist on call if the hospitalist that took care of you is not available. Once you are discharged,  your primary care physician will handle any further medical issues. Please note that NO REFILLS for any discharge medications will be authorized once you are discharged, as it is imperative that you return to your primary care physician (or establish a relationship with a primary care physician if you do not have one) for your aftercare needs so that they can reassess your need for medications and monitor  your lab values.    On the day of Discharge:  VITAL SIGNS:  Blood pressure (!) 116/57, pulse 63, temperature 98.6 F (37 C), temperature source Oral, resp. rate 14, height 5\' 4"  (1.626 m), weight 106.6 kg (235 lb), SpO2 90 %. PHYSICAL EXAMINATION:  GENERAL:  71 y.o.-year-old patient lying in the bed with no acute distress.  EYES: Pupils equal, round, reactive to light and accommodation. No scleral icterus. Extraocular muscles intact.  HEENT: Head atraumatic, normocephalic. Oropharynx and nasopharynx clear.  NECK:  Supple, no jugular venous distention. No thyroid enlargement, no tenderness.  LUNGS: Normal breath sounds bilaterally, no wheezing, rales,rhonchi or crepitation. No use of accessory muscles of respiration.  CARDIOVASCULAR: S1, S2 normal. No murmurs, rubs, or gallops.  ABDOMEN: Soft, non-tender, non-distended. Bowel sounds present. No organomegaly or mass.  EXTREMITIES: No pedal edema, cyanosis, or clubbing.  NEUROLOGIC: Cranial nerves II through XII are intact. Muscle strength 5/5 in all extremities. Sensation intact. Gait not checked.  PSYCHIATRIC: The patient is alert and oriented x 3.  SKIN: No obvious rash, lesion, or ulcer.  DATA REVIEW:   CBC  Recent Labs Lab 08/16/16 0433  WBC 5.8  HGB 12.6  HCT 38.6  PLT 212    Chemistries   Recent Labs Lab 08/16/16 0433  NA 136  K 4.1  CL 98*  CO2 29  GLUCOSE 101*  BUN 15  CREATININE 0.48  CALCIUM 8.6*     Follow-up Information    Primary Care Physician. Schedule an appointment as soon as possible for a visit.   Why:  Please followup with your primary care physician in one week.          Management plans discussed with the patient, family and they are in agreement.  CODE STATUS:  Code Status History    Date Active Date Inactive Code Status Order ID Comments User Context   08/15/2016  1:24 PM 08/16/2016  5:26 PM Full Code [id number]  Delfino LovettVipul Khrystyna Schwalm, MD ED      TOTAL TIME TAKING CARE OF THIS PATIENT: 45  minutes.    Delfino LovettVipul Haylen Bellotti M.D on 08/17/2016 at 5:00 PM  Between 7am to 6pm - Pager - [phone]  After 6pm go to [email or web address] - Scientist, research (life [email or web address])password [email or web address]  [facility] [place] [facility]  Office  [phone]  CC: Primary care physician; No PCP Per Patient   Note: This dictation was prepared with Dragon dictation along with smaller phrase technology. Any transcriptional errors that result from this process are unintentional.

## 2016-08-18 LAB — CULTURE, RESPIRATORY: CULTURE: NORMAL

## 2016-08-18 LAB — CULTURE, RESPIRATORY W GRAM STAIN

## 2016-08-20 LAB — CULTURE, BLOOD (ROUTINE X 2)
Culture: NO GROWTH
Culture: NO GROWTH

## 2019-05-08 ENCOUNTER — Emergency Department: Payer: Medicare Other

## 2019-05-08 ENCOUNTER — Other Ambulatory Visit: Payer: Self-pay

## 2019-05-08 ENCOUNTER — Encounter: Payer: Self-pay | Admitting: Emergency Medicine

## 2019-05-08 ENCOUNTER — Other Ambulatory Visit
Admission: RE | Admit: 2019-05-08 | Discharge: 2019-05-08 | Disposition: A | Discharge status: Home / Self Care | Payer: Medicare Other | Source: Ambulatory Visit | Attending: Cardiovascular Disease | Admitting: Cardiovascular Disease

## 2019-05-08 ENCOUNTER — Emergency Department
Admission: EM | Admit: 2019-05-08 | Discharge: 2019-05-08 | Disposition: A | Discharge status: Home / Self Care | Payer: Medicare Other | Attending: Student | Admitting: Student

## 2019-05-08 DIAGNOSIS — R0781 Pleurodynia: Secondary | ICD-10-CM | POA: Diagnosis present

## 2019-05-08 DIAGNOSIS — U071 COVID-19: Secondary | ICD-10-CM | POA: Insufficient documentation

## 2019-05-08 DIAGNOSIS — R079 Chest pain, unspecified: Secondary | ICD-10-CM | POA: Insufficient documentation

## 2019-05-08 DIAGNOSIS — Z96653 Presence of artificial knee joint, bilateral: Secondary | ICD-10-CM | POA: Insufficient documentation

## 2019-05-08 DIAGNOSIS — I10 Essential (primary) hypertension: Secondary | ICD-10-CM | POA: Diagnosis not present

## 2019-05-08 DIAGNOSIS — Z79899 Other long term (current) drug therapy: Secondary | ICD-10-CM | POA: Insufficient documentation

## 2019-05-08 LAB — CBC
HCT: 30.5 % — ABNORMAL LOW (ref 36.0–46.0)
Hemoglobin: 10.2 g/dL — ABNORMAL LOW (ref 12.0–15.0)
MCH: 28.9 pg (ref 26.0–34.0)
MCHC: 33.4 g/dL (ref 30.0–36.0)
MCV: 86.4 fL (ref 80.0–100.0)
Platelets: 204 10*3/uL (ref 150–400)
RBC: 3.53 MIL/uL — ABNORMAL LOW (ref 3.87–5.11)
RDW: 12.1 % (ref 11.5–15.5)
WBC: 7.5 10*3/uL (ref 4.0–10.5)
nRBC: 0 % (ref 0.0–0.2)

## 2019-05-08 LAB — BASIC METABOLIC PANEL
Anion gap: 10 (ref 5–15)
BUN: 16 mg/dL (ref 8–23)
CO2: 26 mmol/L (ref 22–32)
Calcium: 9.5 mg/dL (ref 8.9–10.3)
Chloride: 92 mmol/L — ABNORMAL LOW (ref 98–111)
Creatinine, Ser: 0.49 mg/dL (ref 0.44–1.00)
GFR calc Af Amer: >60 mL/min (ref >60)
GFR calc non Af Amer: >60 mL/min (ref >60)
Glucose, Bld: 97 mg/dL (ref 70–99)
Potassium: 4 mmol/L (ref 3.5–5.1)
Sodium: 128 mmol/L — ABNORMAL LOW (ref 135–145)

## 2019-05-08 LAB — PROTIME-INR
INR: 1.1 (ref 0.8–1.2)
Prothrombin Time: 13.9 seconds (ref 11.4–15.2)

## 2019-05-08 LAB — SARS CORONAVIRUS 2 BY RT PCR (HOSPITAL ORDER, PERFORMED IN ~~LOC~~ HOSPITAL LAB): SARS Coronavirus 2: POSITIVE — AB

## 2019-05-08 LAB — FIBRIN DERIVATIVES D-DIMER (ARMC ONLY): Fibrin derivatives D-dimer (ARMC): 4413.63 ng/mL (FEU) — ABNORMAL HIGH (ref 0.00–499.00)

## 2019-05-08 LAB — TROPONIN I (HIGH SENSITIVITY)
Troponin I (High Sensitivity): 6 ng/L (ref <18)
Troponin I (High Sensitivity): 6 ng/L (ref <18)
Troponin I (High Sensitivity): 7 ng/L (ref <18)

## 2019-05-08 LAB — APTT: aPTT: 33 seconds (ref 24–36)

## 2019-05-08 IMAGING — CT CT ANGIO CHEST
2 of 6 series · 18 of 46 positions shown · IV contrast (omnipaque)
Comparison: Chest radiograph from earlier today.

CLINICAL DATA: Chest pain and elevated D-dimer. [IC] positive
[DATE].

EXAM:
CT ANGIOGRAPHY CHEST WITH CONTRAST
TECHNIQUE: Multidetector CT imaging of the chest was performed using the
standard protocol during bolus administration of intravenous
contrast. Multiplanar CT image reconstructions and MIPs were
obtained to evaluate the vascular anatomy.
CONTRAST:  75mL OMNIPAQUE IOHEXOL 350 MG/ML SOLN

[Series 5: thins · axial · 0.64mm/px · z∈[-895,-641]mm · 15 of 280 slices shown]
[im 13/280  lung]
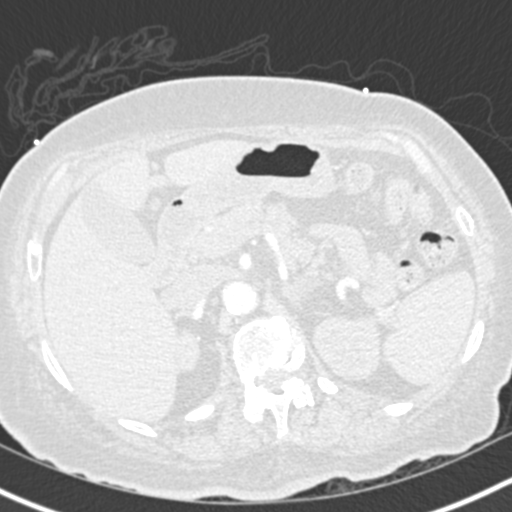
[im 37/280  soft-tissue]
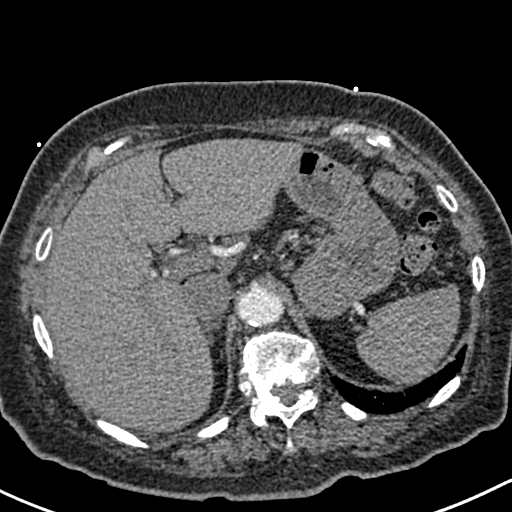
[im 49/280  lung]
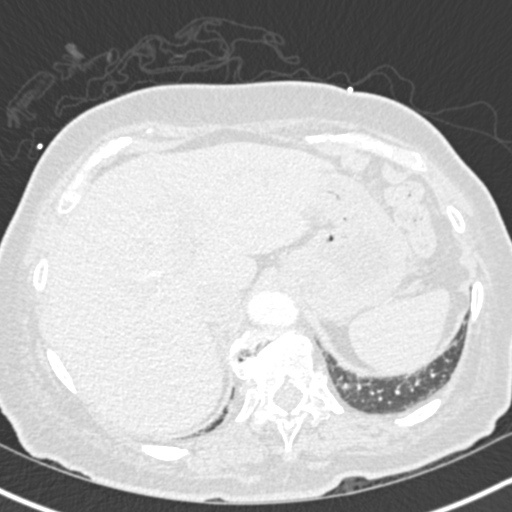
[im 73/280  soft-tissue]
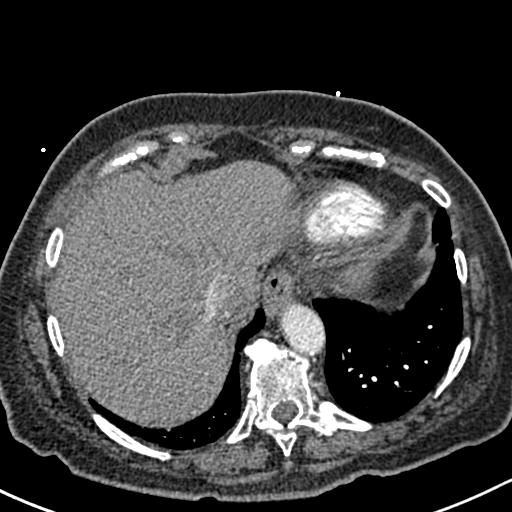
[im 85/280  lung]
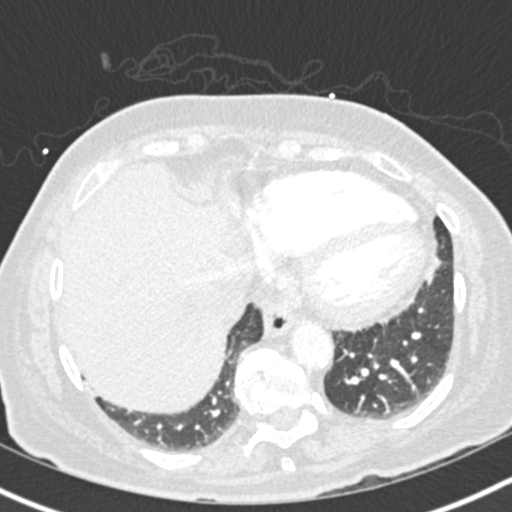
[im 110/280  soft-tissue]
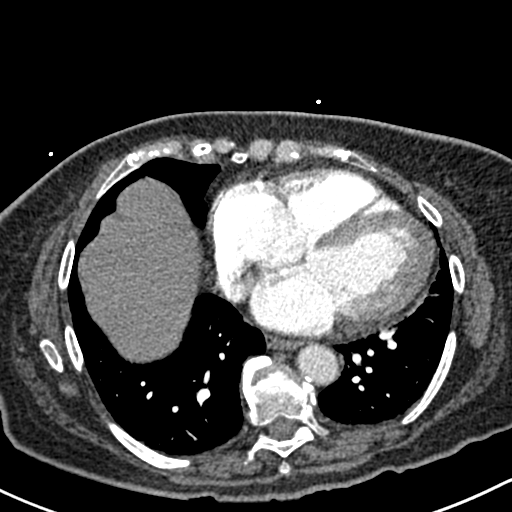
[im 122/280  lung]
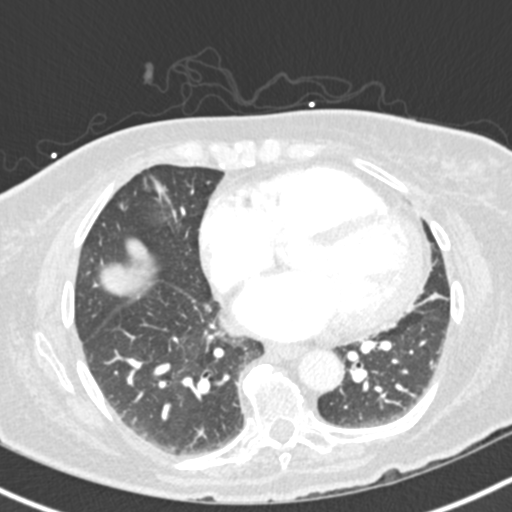
[im 146/280  soft-tissue]
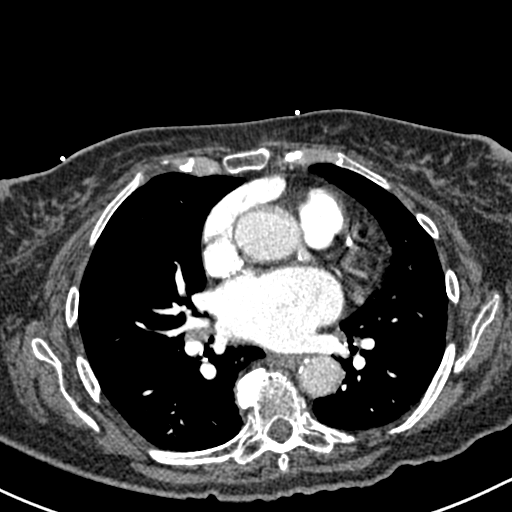
[im 158/280  lung]
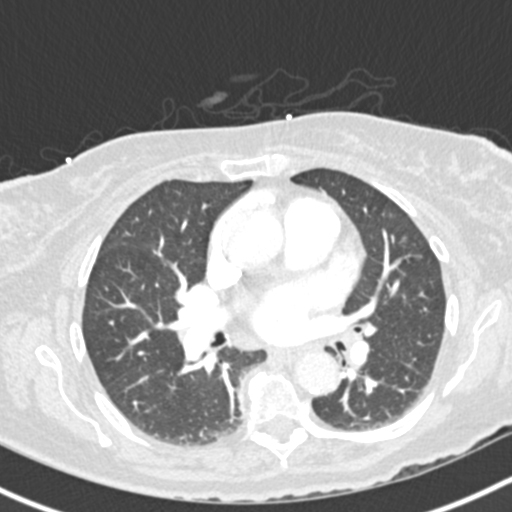
[im 170/280  soft-tissue]
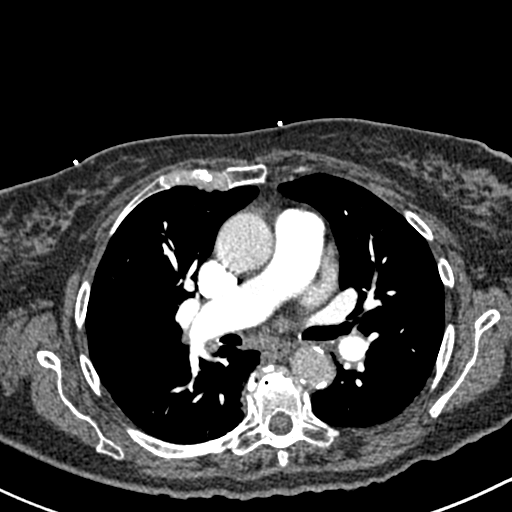
[im 195/280  lung]
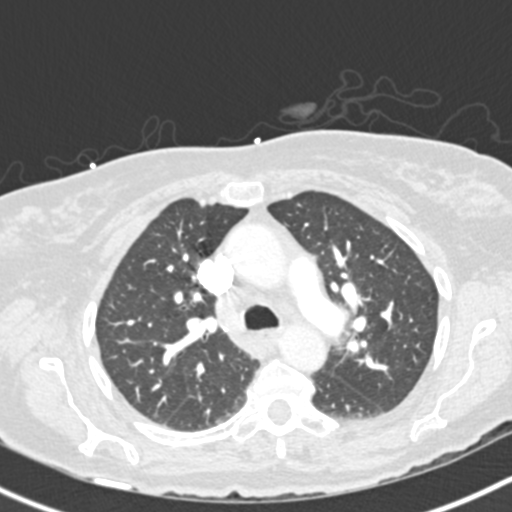
[im 207/280  soft-tissue]
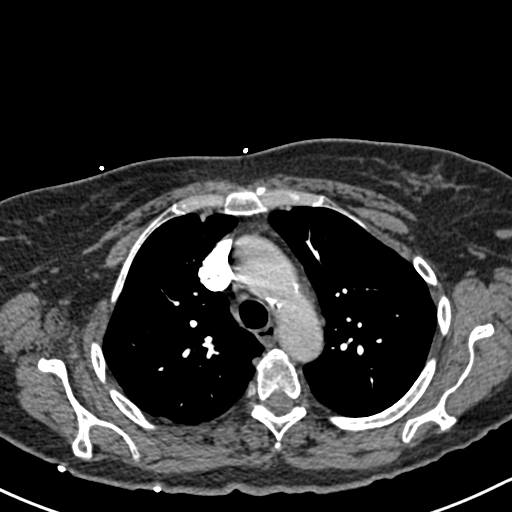
[im 231/280  lung]
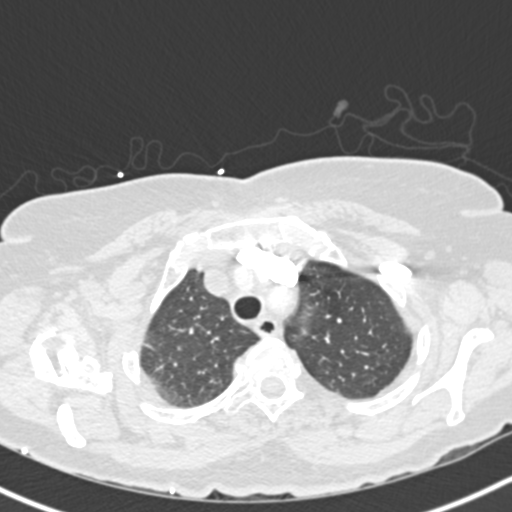
[im 243/280  soft-tissue]
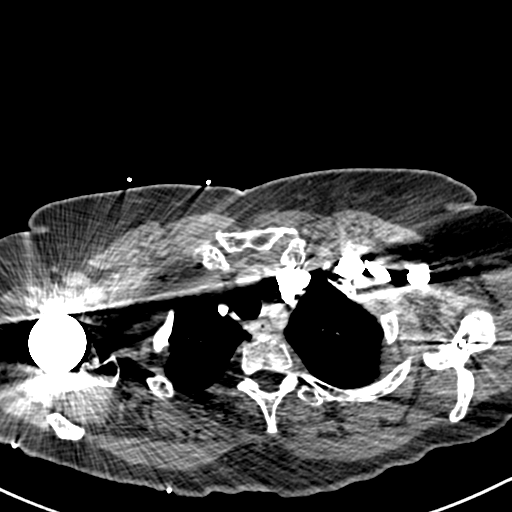
[im 267/280  lung]
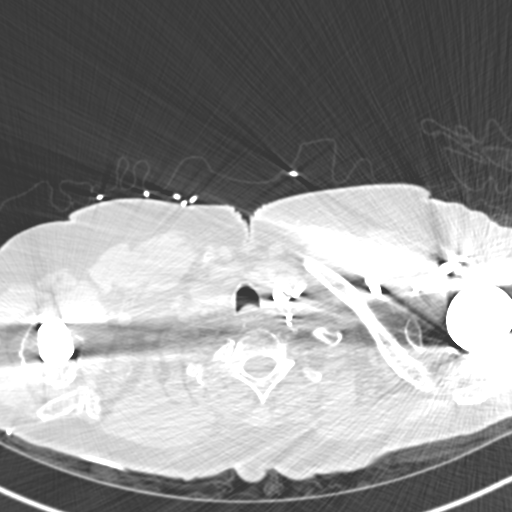

[Series 7: coronal mpr · coronal · 0.59mm/px · 3 of 84 slices shown]
[im 21/84  soft-tissue]
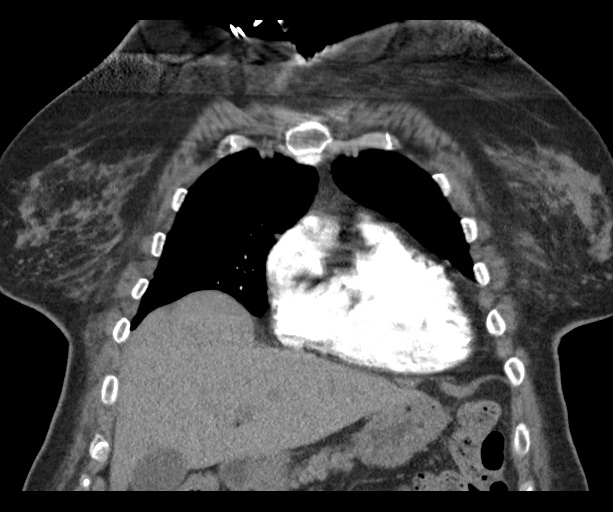
[im 42/84  soft-tissue]
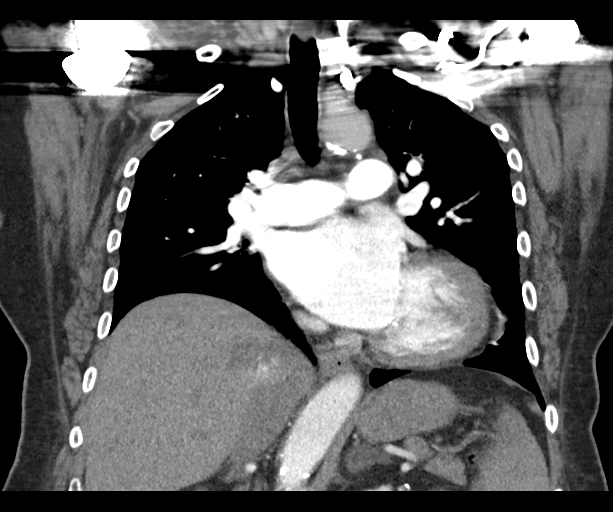
[im 63/84  soft-tissue]
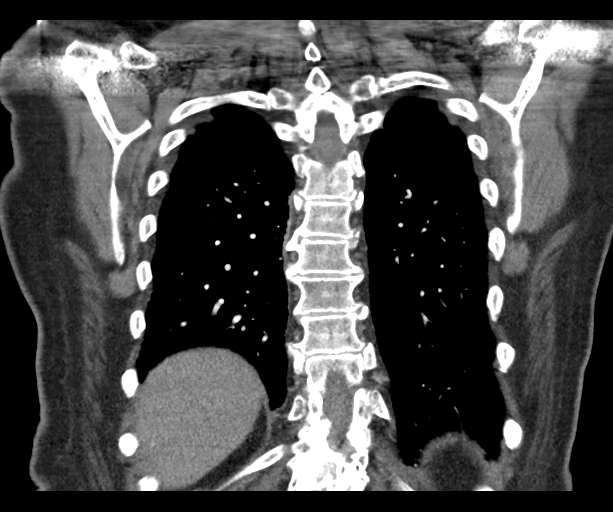

[18 of 46 positions shown; findings below may reference images not displayed]

FINDINGS: Cardiovascular: The study is high quality for the evaluation of
pulmonary embolism. There are no filling defects in the central,
lobar, segmental or subsegmental pulmonary artery branches to
suggest acute pulmonary embolism. Atherosclerotic nonaneurysmal
thoracic aorta. Dilated main pulmonary artery (3.5 cm diameter).
Borderline mild cardiomegaly. No significant pericardial
fluid/thickening. Left anterior descending coronary atherosclerosis.

Mediastinum/Nodes: Thyroid poorly visualized due to IV contrast
related streak artifact. Unremarkable esophagus. No axillary
adenopathy. Mild right hilar adenopathy up to 1.1 cm (series 4/image
36). No left hilar adenopathy. No pathologically enlarged
mediastinal nodes.

Lungs/Pleura: No pneumothorax. No pleural effusion. No acute
consolidative airspace disease, lung masses or significant pulmonary
nodules.

Upper abdomen: Small hiatal hernia. Small amount of contrast reflux
into the IVC and hepatic veins. Generalized thickening of the left
adrenal gland without discrete adrenal nodule, potentially
indicating asymmetric adrenal hyperplasia.

Musculoskeletal: No aggressive appearing focal osseous lesions.
Partially visualized bilateral shoulder arthroplasty. Marked
thoracic spondylosis.

Review of the MIP images confirms the above findings.
IMPRESSION: 1. No pulmonary embolism.
2. Dilated main pulmonary artery, suggesting pulmonary arterial
hypertension.
3. Borderline mild cardiomegaly.
4. Coronary atherosclerosis.
5. No active pulmonary disease.
6. Nonspecific mild right hilar lymphadenopathy, requiring no
follow-up. This recommendation follows ACR consensus guidelines:
Managing Incidental Findings on Thoracic CT: Mediastinal and
Cardiovascular Findings. A White Paper of the ACR Incidental
Findings Committee. [HOSPITAL]. [IC]; 15: [PHONE_NUMBER].
7. Small hiatal hernia.

Aortic Atherosclerosis ([IC]-[IC]).

## 2019-05-08 MED ORDER — IOHEXOL 350 MG/ML SOLN
75.0000 mL | Freq: Once | INTRAVENOUS | Status: AC | PRN
  Administered 2019-05-08: 75 mL via INTRAVENOUS

## 2019-05-08 MED ORDER — SODIUM CHLORIDE 0.9 % IV BOLUS
500.0000 mL | Freq: Once | INTRAVENOUS | Status: AC
  Administered 2019-05-08: 500 mL via INTRAVENOUS

## 2019-05-08 NOTE — ED Notes (Signed)
First Nurse Note: Pt to ED, sent by [facility] for possible PE. Pt was COVID positive on 9/9. Pt is tachycardic with exertion up to the 120's. Pt had elevated D-dimer at [facility]. Pt does not appear to be in distress at this time.

## 2019-05-08 NOTE — ED Triage Notes (Signed)
Patient presents to the ED with chest pain when she breathes in and when she lies down.  Patient states she has had coronavirus for the past month.  Patient states she had symptoms beginning sept. 2nd and tested positive on Sept. 7th.  Patient states her doctor sent her to the ED to make sure she didn't have any blood clots in her lungs.

## 2019-05-08 NOTE — Discharge Instructions (Addendum)
Your imaging was negative for any blood clots in your lung. Your x-ray of your leg was negative for any breaks.  Your COVID test is STILL POSITIVE. This means you need to continue practicing social distancing and quarantining to avoid spread.   Please continue to take any regular, prescribed medications.   Continue to take over the counter acetaminophen as directed on the box to help with fevers as well as aches and pains.   Please return to the ER for any new or worsening symptoms, such as difficulty breathing, vomiting and diarrhea, or chest pain.

## 2019-05-08 NOTE — ED Notes (Addendum)
Pt to front desk asking for a blanket. Pt given blanket. Pt ambulatory without difficulty.

## 2019-05-08 NOTE — ED Provider Notes (Signed)
[facility] Emergency Department Provider Note  ____________________________________________   First MD Initiated Contact with Patient 05/08/19 1536     (approximate)  I have reviewed the triage vital signs and the nursing notes.  History  Chief Complaint Chest Pain    HPI Tara Moody is a 73 y.o. female with a history of hypertension, known COVID positive on 9/10, who presents to the emergency department for worsening cough and pleuritic chest pain.  Patient states she had a known exposure in the beginning of September, and tested positive thereafter after developing mild symptoms.  She states the majority of her symptoms have improved, and she has been out of quarantine for approximately 2 weeks. However over the last several days she has developed cough, chest pain with coughing, as well as pain with deep inspiration.  She was seen in the clinic, and had blood work done which revealed an elevated d-dimer, and was therefore referred to the emergency department for CT scan to rule out PE.  She denies any history of DVT or PE. HS troponin drawn from clinic negative.   Patient states she no longer has any associated fevers, body aches, vomiting, diarrhea.  She does report some subjective dehydration due to decreased appetite, and a few mechanical falls due to tripping, generalized weakness. She has a small area of ecchymosis to her left anterior shin from a fall yesterday, but otherwise denies any associated head injuries or pain related to this.  No preceding chest pain, headache, or syncope preceding her falls.   Past Medical Hx Past Medical History:  Diagnosis Date  . Hypertension     Problem List Patient Active Problem List   Diagnosis Date Noted  . Influenza A 08/15/2016    Past Surgical Hx Past Surgical History:  Procedure Laterality Date  . CATARACT EXTRACTION    . REPLACEMENT TOTAL KNEE BILATERAL      Medications Prior to Admission  medications   Medication Sig Start Date End Date Taking? Authorizing Provider  acetaminophen (TYLENOL) 325 MG tablet Take 650 mg by mouth every 4 (four) hours as needed.    [provider]  amLODipine (NORVASC) 5 MG tablet Take 5 mg by mouth daily.    [provider]  citalopram (CELEXA) 20 MG tablet Take 20 mg by mouth daily.    [provider]  fluticasone (FLONASE) 50 MCG/ACT nasal spray Place 1 spray into both nostrils daily.    [provider]  guaiFENesin (MUCINEX) 600 MG 12 hr tablet Take 600 mg by mouth 2 (two) times daily.    [provider]  loratadine (CLARITIN) 10 MG tablet Take 10 mg by mouth daily.    [provider]  losartan-hydrochlorothiazide (HYZAAR) 100-25 MG tablet Take 1 tablet by mouth daily.    [provider]  metoprolol (LOPRESSOR) 100 MG tablet Take 100 mg by mouth 2 (two) times daily.    [provider]  Multiple Vitamin (MULTIVITAMIN) tablet Take 1 tablet by mouth daily.    [provider]    Allergies Shrimp [shellfish allergy]  Family Hx No family history on file.  Social Hx Social History   Tobacco Use  . Smoking status: Never Smoker  . Smokeless tobacco: Never Used  Substance Use Topics  . Alcohol use: No  . Drug use: No     Review of Systems  Constitutional: Negative for fever, chills. Eyes: Negative for visual changes. ENT: Negative for sore throat. Cardiovascular: + for chest pain. Respiratory: +  cough Gastrointestinal: Negative for nausea, vomiting.  Genitourinary: Negative for dysuria. Musculoskeletal: Negative for leg swelling. Skin: Negative for rash. Neurological: Negative for for headaches.   Physical Exam  Vital Signs: ED Triage Vitals  Enc Vitals Group     BP 05/08/19 1345 112/64     Pulse Rate 05/08/19 1345 (!) 56     Resp 05/08/19 1345 16     Temp 05/08/19 1345 98.6 F (37 C)     Temp Source 05/08/19 1345 Oral     SpO2 05/08/19 1345 100  %     Weight 05/08/19 1347 184 lb (83.5 kg)     Height 05/08/19 1347 5' 3.5" (1.613 m)     Head Circumference --      Peak Flow --      Pain Score 05/08/19 1346 7     Pain Loc --      Pain Edu? --      Excl. in GC? --     Constitutional: Alert and oriented.  Head: Normocephalic. Atraumatic. Eyes: Conjunctivae clear. Sclera anicteric. Nose: No congestion. No rhinorrhea. Mouth/Throat: Mucous membranes are moist.  Neck: No stridor.   Cardiovascular: Mild bradycardia, regular rhythm. No murmurs. Extremities well perfused. Respiratory: Normal respiratory effort.  Lungs CTAB. Gastrointestinal: Soft. Non-tender. Non-distended.  Musculoskeletal: No lower extremity edema. No deformities. LLE NT to palpation. Compartments are soft and compressible.  Ecchymosis to the left anterior shin. Neurologic:  Normal speech and language. No gross focal neurologic deficits are appreciated.  Skin: Ecchymosis to left anterior shin. Psychiatric: Mood and affect are appropriate for situation.  EKG  Personally reviewed.   Rate: 56 Rhythm: sinus Axis: normal Intervals: WNL No acute ischemic changes Questionable delta wave, but PR interval is normal, unlikely to be WPW No STEMI    Radiology  CT PE: IMPRESSION: 1. No pulmonary embolism. 2. Dilated main pulmonary artery, suggesting pulmonary arterial hypertension. 3. Borderline mild cardiomegaly. 4. Coronary atherosclerosis. 5. No active pulmonary disease. 6. Nonspecific mild right hilar lymphadenopathy, requiring no follow-up. This recommendation follows ACR consensus guidelines: Managing Incidental Findings on Thoracic CT: Mediastinal and Cardiovascular Findings. A White Paper of the ACR Incidental Findings Committee. J Am Coll Radiol. 2018; 15: 1610-9604: 1087-1096. 7. Small hiatal hernia.  XR tib/fib: IMPRESSION:  No fracture or dislocation  Procedures  Procedure(s) performed (including critical care):  Procedures   Initial Impression /  Assessment and Plan / ED Course  73 y.o. female who presents to the ED for cough and pleuritic chest pain, in setting of + COVID test on 9/11.  Elevated d-dimer from clinic.  As above.   Ddx: pleurisy, pericarditis, PE, symptoms 2/2 known COVID  Plan: labs, imaging  BMP reveals mild hyponatremia and hypochloremia, consistent with decreased PO intake.  We will give a small bolus of IV fluids here and then encourage PO.  CT is negative for PE, other findings suggestive of pulmonary arterial hypertension.  Repeat (first lab drawn in clinic) high-sensitivity troponin is negative.  EKG without acute ischemic changes.  X-ray negative for fracture or dislocation.  As such, will plan for discharge.  Her repeat COVID swab here is positive, updated her on these results, and the need for continued quarantine, hand hygiene, etc, and appropriate precautions.  She voices understanding of this.  Discussed return precautions.    Final Clinical Impression(s) / ED Diagnosis  Final diagnoses:  Pleuritic chest pain  COVID-19       Note:  This document was prepared using Dragon voice recognition software  and may include unintentional dictation errors.   Lilia Pro., MD 05/08/19 [phone]

## 2019-11-02 ENCOUNTER — Other Ambulatory Visit: Payer: Self-pay

## 2019-11-02 ENCOUNTER — Ambulatory Visit: Payer: Medicare Other | Attending: Infectious Diseases

## 2019-11-02 DIAGNOSIS — M6281 Muscle weakness (generalized): Secondary | ICD-10-CM | POA: Diagnosis present

## 2019-11-02 DIAGNOSIS — Z9181 History of falling: Secondary | ICD-10-CM | POA: Diagnosis not present

## 2019-11-02 DIAGNOSIS — R262 Difficulty in walking, not elsewhere classified: Secondary | ICD-10-CM

## 2019-11-02 DIAGNOSIS — R2681 Unsteadiness on feet: Secondary | ICD-10-CM | POA: Diagnosis present

## 2019-11-02 NOTE — Therapy (Signed)
[place] [facility] [address], [place], [address] Phone: [phone]   Fax:  [phone]  Physical Therapy Evaluation  Patient Details  Name: Tara Moody MRN: [id number] Date of Birth: [birth year] Referring Provider (PT): Dierdre Harness, MD   Encounter Date: 11/02/2019  PT End of Session - 11/02/19 1357    Visit Number  1    Number of Visits  17    Date for PT Re-Evaluation  12/31/19    Authorization Type  1    Authorization Time Period  of 10 progress report    PT Start Time  1357    PT Stop Time  1516    PT Time Calculation (min)  79 min    Equipment Utilized During Treatment  Gait belt    Activity Tolerance  Patient tolerated treatment well    Behavior During Therapy  WFL for tasks assessed/performed       Past Medical History:  Diagnosis Date  . Hypertension     Past Surgical History:  Procedure Laterality Date  . CATARACT EXTRACTION    . REPLACEMENT TOTAL KNEE BILATERAL      There were no vitals filed for this visit.   Subjective Assessment - 11/02/19 1404    Subjective  Back is bothering her currently (feels like she has a hard time straightening out)/ L low back at L1 and L3 and L4 dermatomes:  3/10 (currently, pt sitting on a chair), 7/10 at worst for the past 3 months.    Pertinent History  Gait abnormality. Pt has had B knee, B shoulder replacements, B carpal tunnel surgeries. Pt hurt her L knee 2001. 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 Husband had a TBI and pt had take care of him until his death. Has had bouts of her L knee hurting. In the last 3 years, pt has been falling more. Fell 5 times in August 2020. Was outside walking the dogs and has had a hard time picking up her feet (has had that). Feels more tired since having COVID.  Has not fallen in the past 2 months because she has been paying attention. Fell up the steps which knocked the wind out of her in 09/2019.  Had COVID end of  August/beginning September 2020. Wants to prevent falls. Larey Seat one more time in March 2021 when her dogs (jack russel and beagle; pt was walking them at the same time). Pt was on a road with little traffic but it has hills. Pt did not raise her R foot high enough and tripped on a step when pt fell in February 2021. Wonders if her R foot if shorter. When pt fell in August 2020, pt states that she tripped every time. Pt now able to catch herself when she trips. Kneels on her L knee to stand up from a fall. Pt reports tripping on R foot more than the L.  Pt has a house in [place]. Pt currently living in [place]. Planning on returning to [place] in September 2021. [place]: pt lives alone in a 1 story home. 3 steps front door with B rail, 4 steps back door, B rails. [place] home: 1 story home, 1 small step to enter, no rails (not a problem per pt). Same for back door.    Patient Stated Goals  Be better able to perform floor to stand transfers.    Currently in Pain?  Yes    Pain Score  3  Pain Location  Back    Pain Orientation  Lower;Left;Right    Pain Type  Chronic pain    Pain Onset  More than a month ago    Pain Frequency  Occasional    Aggravating Factors   not sleeping well, packing to go on a trip, not moving around much (such as spenging all day sitting). Standing and washing dishes and sweeping with heavy brooms. Carrying stuff such as groceries.    Pain Relieving Factors  back, B LE: walking the dogs.; Low back and L LE: sitting         [facility] PT Assessment - 11/02/19 1358      Assessment   Medical Diagnosis  Gait abnormality    Referring Provider (PT)  Dierdre Harness, MD    Onset Date/Surgical Date  06/04/19    Prior Therapy  yes      Precautions   Precaution Comments  Fall risk      Restrictions   Other Position/Activity Restrictions  No known weight bearing restrictions      Balance Screen   Has the patient fallen in the past 6 months  Yes    How many times?  08 September 2019   Has the patient had a decrease in activity level because of a fear of falling?   --   Pt expresses fear of falling   Is the patient reluctant to leave their home because of a fear of falling?   --   Pt expresses fear of falling     Prior Function   Vocation  Retired      Press photographer Comments  protracted neck, R shoulder lower ; R lateral shift, R iliac creast higher, B foot pronation L > R      AROM   Lumbar Flexion  full    Lumbar Extension  WFL    Lumbar - Right Side [facility] with L lateral iliac pain (same pathway as L low back pain)    Lumbar - Left Side Bend  WFL with L low back pain    Lumbar - Right Rotation  limited    Lumbar - Left Rotation  WFL      PROM   Overall PROM Comments  Hip IR at 90/90 position: R WFL, L WFL      Strength   Right Hip Flexion  4/5    Right Hip Extension  3/5   seated manually resisted   Right Hip ABduction  4/5   seated manually resisted clamshell   Left Hip Flexion  4+/5    Left Hip Extension  4-/5   seated manually resisted   Left Hip ABduction  4/5   seated manually resisted clamshell   Right Knee Flexion  4/5    Right Knee Extension  5/5    Left Knee Flexion  4/5    Left Knee Extension  4+/5    Right Ankle Dorsiflexion  4/5    Left Ankle Dorsiflexion  4/5      Special Tests   Other special tests  (-) repeated flexion test      Ambulation/Gait   Gait Comments  Antalgic, decreased stance L LE, L anterior pelvic rotation during L LE swing phase. Slight unsteadiness.       Dynamic Gait Index   Level Surface  Mild Impairment    Change in Gait Speed  Moderate Impairment    Gait with Horizontal Head Turns  Moderate Impairment   Deviates to the L when walking to the L.    Gait with Vertical Head Turns  Mild Impairment    Gait and Pivot Turn  Mild Impairment    Step Over Obstacle  Severe Impairment    Step Around Obstacles  Normal    Steps  Mild Impairment    Total Score  13    DGI  comment:  < 19/24 suggests increased risk for falls                Objective measurements completed on examination: See above findings.     No latex band allergies  Blood pressure is controlled per pt.    No B hip surgeries  Pt states she is usually walking when she loses balance.  Gait: increased lumbar rotation    Pt states R LE pain (L5/S1 dermatome from posterior hip to distal leg; comes and goes since 5-6 months ago/acute on chronic). Pt also states having B plantar fasciitis.   Pt also states feeling L lateral thigh pain and crossing her L leg over R (piriformis stretch) helps.      Pt states having L knee pain. Pt states having neck pain.   No back pain in standing      Pt is a 74 year old female who came to physical therapy secondary to history of falls. She also presents with altered gait pattern and posture, low back and LE pain/symptoms, reproduction of low back pain with L side bending, bilateral glute med and max weakness, decreased B femoral control, and decreased balance with a score of 13 on the DGI (score less than 19/24 suggests increased fall risk), and difficulty performing tasks such as walking her dogs and stair negotiation secondary to balance difficulty. Pt will benefit from skilled physical therapy services to address the aforementioned deficits.     Get FOTO information next visit if able    PT Education - 11/02/19 1843    Education Details  plan of care    Person(s) Educated  Patient    Methods  Explanation    Comprehension  Verbalized understanding       PT Short Term Goals - 11/02/19 1900      PT SHORT TERM GOAL #1   Title  Pt will be independent with her HEP to improve balance and decrease fall risk.    Time  3    Period  Weeks    Status  New    Target Date  12/17/19        PT Long Term Goals - 11/02/19 1901      PT LONG TERM GOAL #1   Title  Patient will improve bilateral hip extension and abduction strength to  improve steadiness with gait, and standing tasks, and decrease fall risk.    Time  8    Period  Weeks    Status  New    Target Date  12/31/19      PT LONG TERM GOAL #2   Title  Patient will improve her DGI balance score to 19/24 or more as a demonstration of improved balance.    Baseline  13/24 DGI (11/02/2019)    Time  8    Period  Weeks    Status  New    Target Date  12/31/19      PT LONG TERM GOAL #3   Title  Patient will have a decrease in low back pain to 4/10 or less at worst  to promote ability to peform standing tasks such as washing dishes or sweeping the floor more comfortably    Baseline  7/10 low back pain at most for the past 3 months (11/02/2019)    Time  8    Period  Weeks    Status  New    Target Date  12/31/19      PT LONG TERM GOAL #4   Title  Pt will be able to step over a shoe box and clear both feet independently at least 10x each LE to improve balance and decrease fall risk.    Baseline  Unable to clear shoe box when stepping over it (11/02/2019).    Time  8    Period  Weeks    Status  New    Target Date  12/31/19      PT LONG TERM GOAL #5   Title  Pt will report decreased to no difficulty with stair negotiation with one UE assist (4 regular steps) to promote ability to get into and out of her house more easily.    Baseline  Difficulty with stair negotiation secondary to fear of falling (11/02/2019)    Time  8    Period  Weeks    Status  New    Target Date  12/31/19             Plan - 11/02/19 1844    Clinical Impression Statement  Pt is a 74 year old female who came to physical therapy secondary to history of falls. She also presents with altered gait pattern and posture, low back and LE pain/symptoms, reproduction of low back pain with L side bending, bilateral glute med and max weakness, decreased B femoral control, and decreased balance with a score of 13 on the DGI (score less than 19/24 suggests increased fall risk), and difficulty performing  tasks such as walking her dogs and stair negotiation secondary to balance difficulty. Pt will benefit from skilled physical therapy services to address the aforementioned deficits.    Personal Factors and Comorbidities  Age;Comorbidity 3+;Fitness;Past/Current Experience;Time since onset of injury/illness/exacerbation    Comorbidities  B total shoulder, B total knee replacements, low back pain with radiating symptoms, L knee pain, neck pain    Examination-Activity Limitations  Lift;Stairs;Carry    Stability/Clinical Decision Making  Evolving/Moderate complexity    Clinical Decision Making  Moderate    Rehab Potential  Fair    PT Frequency  2x / week    PT Duration  8 weeks    PT Treatment/Interventions  Therapeutic activities;Gait training;Stair training;Functional mobility training;Therapeutic exercise;Balance training;Neuromuscular re-education;Patient/family education;Manual techniques;Dry needling;Aquatic Therapy;Canalith Repostioning;Electrical Stimulation;Iontophoresis 4mg /ml Dexamethasone;Vestibular    PT Next Visit Plan  posture, scapular strengthening, thoracic extension, trunk, glute strengthening, femoral control, balance, manual techniques, modalities PRN    Consulted and Agree with Plan of Care  Patient       Patient will benefit from skilled therapeutic intervention in order to improve the following deficits and impairments:  Pain, Postural dysfunction, Improper body mechanics, Difficulty walking, Decreased strength, Decreased balance, Abnormal gait  Visit Diagnosis: History of falling - Plan: PT plan of care cert/re-cert  Unsteadiness on feet - Plan: PT plan of care cert/re-cert  Difficulty in walking, not elsewhere classified - Plan: PT plan of care cert/re-cert  Muscle weakness (generalized) - Plan: PT plan of care cert/re-cert     Problem List Patient Active Problem List   Diagnosis Date Noted  . Influenza A 08/15/2016  Joneen Boers PT, DPT  11/02/2019, 7:18  PM  [place] [facility] [address] [place], [address] Phone: [phone]   Fax:  [phone]  Name: Tara Moody MRN: [id number] Date of Birth: May 13, 1946

## 2019-11-04 ENCOUNTER — Other Ambulatory Visit: Payer: Self-pay

## 2019-11-04 ENCOUNTER — Ambulatory Visit: Payer: Medicare Other

## 2019-11-04 DIAGNOSIS — Z9181 History of falling: Secondary | ICD-10-CM

## 2019-11-04 DIAGNOSIS — R2681 Unsteadiness on feet: Secondary | ICD-10-CM

## 2019-11-04 DIAGNOSIS — R262 Difficulty in walking, not elsewhere classified: Secondary | ICD-10-CM

## 2019-11-04 DIAGNOSIS — M6281 Muscle weakness (generalized): Secondary | ICD-10-CM

## 2019-11-04 NOTE — Therapy (Signed)
[facility] PH[facility]OR[facility] MEDICINE [address], [place], [address] Phone: [phone]   Fax:  [phone]  Physical Therapy Treatment  Patient Details  Name: Alveena Taira MRN: [id number] Date of Birth: 03/22/1946 Referring Provider (PT): Dierdre Harness, MD   Encounter Date: 11/04/2019  PT End of Session - 11/04/19 1530    Visit Number  2    Number of Visits  17    Date for PT Re-Evaluation  12/31/19    Authorization Type  2    Authorization Time Period  of 10 progress report    PT Start Time  1530    PT Stop Time  1601    PT Time Calculation (min)  31 min    Equipment Utilized During Treatment  Gait belt    Activity Tolerance  Patient tolerated treatment well    Behavior During Therapy  [facility] for tasks assessed/performed       Past Medical History:  Diagnosis Date  . Hypertension     Past Surgical History:  Procedure Laterality Date  . CATARACT EXTRACTION    . REPLACEMENT TOTAL KNEE BILATERAL      There were no vitals filed for this visit.  Subjective Assessment - 11/04/19 1531    Subjective  Walked a lot yesterday. Stumbled. Has slight L low back pain. 6/10 currently.    Pertinent History  Gait abnormality. Pt has had B knee, B shoulder replacements, B carpal tunnel surgeries. Pt hurt her L knee 2001. 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 Husband had a TBI and pt had take care of him until his death. Has had bouts of her L knee hurting. In the last 3 years, pt has been falling more. Fell 5 times in August 2020. Was outside walking the dogs and has had a hard time picking up her feet (has had that). Feels more tired since having COVID.  Has not fallen in the past 2 months because she has been paying attention. Fell up the steps which knocked the wind out of her in 09/2019.  Had COVID end of August/beginning September 2020. Wants to prevent falls. Larey Seat one more time in March 2021 when her dogs (jack russel and beagle; pt  was walking them at the same time). Pt was on a road with little traffic but it has hills. Pt did not raise her R foot high enough and tripped on a step when pt fell in February 2021. Wonders if her R foot if shorter. When pt fell in August 2020, pt states that she tripped every time. Pt now able to catch herself when she trips. Kneels on her L knee to stand up from a fall. Pt reports tripping on R foot more than the L.  Pt has a house in [place]. Pt currently living in [place]. Planning on returning to [place] in September 2021. [place]: pt lives alone in a 1 story home. 3 steps front door with B rail, 4 steps back door, B rails. [place] home: 1 story home, 1 small step to enter, no rails (not a problem per pt). Same for back door.    Patient Stated Goals  Be better able to perform floor to stand transfers.    Currently in Pain?  Yes    Pain Score  6     Pain Onset  More than a month ago  PT Education - 11/04/19 2008    Education Details  ther-ex    Person(s) Educated  Patient    Methods  Explanation;Demonstration;Tactile cues;Verbal cues    Comprehension  Returned demonstration;Verbalized understanding      Objective    No latex band allergies Blood pressure is controlled per pt.    No B hip surgeries  Pt states she is usually walking when she loses balance.  Gait: increased lumbar rotation    Pt states R LE pain (L5/S1 dermatome from posterior hip to distal leg; comes and goes since 5-6 months ago/acute on chronic). Pt also states having B plantar fasciitis.   Pt also states feeling L lateral thigh pain and crossing her L leg over R (piriformis stretch) helps.                 Pt states having L knee pain. Pt states having neck pain.   No back pain in standing   Medbridge Access Code [email or web address]   Therapeutic exercise  Supine clamshell red band 10x3  Feels like a relief per pt.   Supine posterior pelvic tilt  10x3 with 5 second holds   Bridge with red band resisting hip abduction/ER 10x2  L posterior thigh symptoms afterwards  Supine posterior pelvic tilt 10x5 seconds  Decreased L posterior thigh pulling sensation   Supine L SKTC 10x5 seconds  No L posterior thigh symptoms afterwards  Supine hip IR at 90/90  L 10x2. Felt good for pt.   R 10x3. Decreased tightness palpated    Improved exercise technique, movement at target joints, use of target muscles after mod verbal, visual, tactile cues.     Response to treatment Pt states back feels better after session. 3/10   Clinical impression  Worked on core, glute med, and max strengthening to promote single leg stance stability strength when stepping over obstacles with contralateral LE to promote increased foot clearance and to decrease extension pressure to low back. Decreased back pain reported after session. Pt will benefit from continued skilled physical therapy services to improve strength, balance, and function.      PT Short Term Goals - 11/02/19 1900      PT SHORT TERM GOAL #1   Title  Pt will be independent with her HEP to improve balance and decrease fall risk.    Time  3    Period  Weeks    Status  New    Target Date  12/17/19        PT Long Term Goals - 11/02/19 1901      PT LONG TERM GOAL #1   Title  Patient will improve bilateral hip extension and abduction strength to improve steadiness with gait, and standing tasks, and decrease fall risk.    Time  8    Period  Weeks    Status  New    Target Date  12/31/19      PT LONG TERM GOAL #2   Title  Patient will improve her DGI balance score to 19/24 or more as a demonstration of improved balance.    Baseline  13/24 DGI (11/02/2019)    Time  8    Period  Weeks    Status  New    Target Date  12/31/19      PT LONG TERM GOAL #3   Title  Patient will have a decrease in low back pain to 4/10 or less at worst to promote ability to peform standing tasks such as  washing dishes or sweeping the floor more comfortably    Baseline  7/10 low back pain at most for the past 3 months (11/02/2019)    Time  8    Period  Weeks    Status  New    Target Date  12/31/19      PT LONG TERM GOAL #4   Title  Pt will be able to step over a shoe box and clear both feet independently at least 10x each LE to improve balance and decrease fall risk.    Baseline  Unable to clear shoe box when stepping over it (11/02/2019).    Time  8    Period  Weeks    Status  New    Target Date  12/31/19      PT LONG TERM GOAL #5   Title  Pt will report decreased to no difficulty with stair negotiation with one UE assist (4 regular steps) to promote ability to get into and out of her house more easily.    Baseline  Difficulty with stair negotiation secondary to fear of falling (11/02/2019)    Time  8    Period  Weeks    Status  New    Target Date  12/31/19            Plan - 11/04/19 2008    Clinical Impression Statement  Worked on core, glute med, and max strengthening to promote single leg stance stability strength when stepping over obstacles with contralateral LE to promote increased foot clearance and to decrease extension pressure to low back. Decreased back pain reported after session. Pt will benefit from continued skilled physical therapy services to improve strength, balance, and function.    Personal Factors and Comorbidities  Age;Comorbidity 3+;Fitness;Past/Current Experience;Time since onset of injury/illness/exacerbation    Comorbidities  B total shoulder, B total knee replacements, low back pain with radiating symptoms, L knee pain, neck pain    Examination-Activity Limitations  Lift;Stairs;Carry    Stability/Clinical Decision Making  Evolving/Moderate complexity    Rehab Potential  Fair    PT Frequency  2x / week    PT Duration  8 weeks    PT Treatment/Interventions  Therapeutic activities;Gait training;Stair training;Functional mobility training;Therapeutic  exercise;Balance training;Neuromuscular re-education;Patient/family education;Manual techniques;Dry needling;Aquatic Therapy;Canalith Repostioning;Electrical Stimulation;Iontophoresis 4mg /ml Dexamethasone;Vestibular    PT Next Visit Plan  posture, scapular strengthening, thoracic extension, trunk, glute strengthening, femoral control, balance, manual techniques, modalities PRN    Consulted and Agree with Plan of Care  Patient       Patient will benefit from skilled therapeutic intervention in order to improve the following deficits and impairments:  Pain, Postural dysfunction, Improper body mechanics, Difficulty walking, Decreased strength, Decreased balance, Abnormal gait  Visit Diagnosis: Unsteadiness on feet  Difficulty in walking, not elsewhere classified  Muscle weakness (generalized)  History of falling     Problem List Patient Active Problem List   Diagnosis Date Noted  . Influenza A 08/15/2016    10/13/2016 PT, DPT   11/04/2019, 8:13 PM  [facility] PHYSICAL AND SPORTS MEDICINE [address], [address], [place] Phone: [phone]   Fax:  [phone]  Name: Damon Baisch MRN: Elmon Else Date of Birth: 24-Feb-1946

## 2019-11-10 ENCOUNTER — Other Ambulatory Visit: Payer: Self-pay

## 2019-11-10 ENCOUNTER — Ambulatory Visit: Payer: Medicare Other | Attending: Infectious Diseases

## 2019-11-10 DIAGNOSIS — R262 Difficulty in walking, not elsewhere classified: Secondary | ICD-10-CM

## 2019-11-10 DIAGNOSIS — M6281 Muscle weakness (generalized): Secondary | ICD-10-CM

## 2019-11-10 DIAGNOSIS — Z9181 History of falling: Secondary | ICD-10-CM | POA: Diagnosis present

## 2019-11-10 DIAGNOSIS — R2681 Unsteadiness on feet: Secondary | ICD-10-CM

## 2019-11-10 NOTE — Therapy (Signed)
[facility] PH[facility]IC[facility] [address] [place], [address] Phone: [phone]   Fax:  [phone]  Physical Therapy Treatment  Patient Details  Name: Tara Moody MRN: [id number] Date of Birth: 05/10/1946 Referring Provider (PT): Dierdre Harness, MD   Encounter Date: 11/10/2019  PT End of Session - 11/10/19 1525    Visit Number  3    Number of Visits  17    Date for PT Re-Evaluation  12/31/19    Authorization Type  3    Authorization Time Period  of 10 progress report    PT Start Time  1515    PT Stop Time  1555    PT Time Calculation (min)  40 min    Activity Tolerance  Patient tolerated treatment well    Behavior During Therapy  [facility] for tasks assessed/performed       Past Medical History:  Diagnosis Date  . Hypertension     Past Surgical History:  Procedure Laterality Date  . CATARACT EXTRACTION    . REPLACEMENT TOTAL KNEE BILATERAL      There were no vitals filed for this visit.  Subjective Assessment - 11/10/19 1516    Subjective  Patient reported that she is feeling good, reported yesterday was a good day, did not have pain. Stated she wasn't walking "crooked". Stated today she is walking more gingerly today, more tired today, thinks maybe she slept in an uncomfortable position.    Pertinent History  Gait abnormality. Pt has had B knee, B shoulder replacements, B carpal tunnel surgeries. Pt hurt her L knee 2001. 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 Husband had a TBI and pt had take care of him until his death. Has had bouts of her L knee hurting. In the last 3 years, pt has been falling more. Fell 5 times in August 2020. Was outside walking the dogs and has had a hard time picking up her feet (has had that). Feels more tired since having COVID.  Has not fallen in the past 2 months because she has been paying attention. Fell up the steps which knocked the wind out of her in 09/2019.  Had COVID end of  August/beginning September 2020. Wants to prevent falls. Larey Seat one more time in March 2021 when her dogs (jack russel and beagle; pt was walking them at the same time). Pt was on a road with little traffic but it has hills. Pt did not raise her R foot high enough and tripped on a step when pt fell in February 2021. Wonders if her R foot if shorter. When pt fell in August 2020, pt states that she tripped every time. Pt now able to catch herself when she trips. Kneels on her L knee to stand up from a fall. Pt reports tripping on R foot more than the L.  Pt has a house in [place]. Pt currently living in [place]. Planning on returning to [place] in September 2021. [place]: pt lives alone in a 1 story home. 3 steps front door with B rail, 4 steps back door, B rails. [place] home: 1 story home, 1 small step to enter, no rails (not a problem per pt). Same for back door.    Patient Stated Goals  Be better able to perform floor to stand transfers.    Currently in Pain?  Yes    Pain Score  2     Pain Location  Back  Pain Orientation  Lower;Left;Right    Pain Descriptors / Indicators  Dull    Pain Type  Chronic pain    Pain Onset  More than a month ago    Pain Frequency  Intermittent       No latex band allergies Blood pressure is controlled per pt.    No B hip surgeries   Pt states she is usually walking when she loses balance.  Gait: increased lumbar rotation      Pt states R LE pain (L5/S1 dermatome from posterior hip to distal leg; comes and goes since 5-6 months ago/acute on chronic). Pt also states having B plantar fasciitis.    Pt also states feeling L lateral thigh pain and crossing her L leg over R (piriformis stretch) helps.                 Pt states having L knee pain. Pt states having neck pain.    No back pain in standing    Medbridge Access Code [email or web address]     Therapeutic exercise   Supine clamshell red band 10x3                Supine posterior pelvic tilt 10x3 with 5  second holds  Reported that it fel like relief per pt   Bridge  In supine 3x10  Supine posterior pelvic tilt 10x5 seconds             Decreased L posterior thigh pulling sensation     Supine L SKTC 5 reps, 10 second holds             No L posterior thigh symptoms afterwards   Supine hip IR/ER at 90/90             L 3x30sec stretch Felt good for pt.              R 3x30sec stretch  Felt good for pt    abdominal activation/TrA activation with therapy ball in supine 2x10x5 second holds    Improved exercise technique, movement at target joints, use of target muscles after mod verbal, visual, tactile cues.        Clinical impression/pt response: Pt reported relief with stretches/exercises this session. Very motivated to continue working with PT. Reported 0/10 pain at end of sessoin. Focused on promoting strength to improve functional abilities such as single leg stance stability, core strengthening for posture/lifting/twisting, etc. The patient would benefit from further skilled PT intervention to continue to progress towards goals.    PT Education - 11/10/19 1522    Education Details  ther-ex    Person(s) Educated  Patient    Methods  Explanation;Demonstration;Tactile cues;Verbal cues    Comprehension  Verbalized understanding;Returned demonstration;Verbal cues required;Tactile cues required;Need further instruction       PT Short Term Goals - 11/02/19 1900      PT SHORT TERM GOAL #1   Title  Pt will be independent with her HEP to improve balance and decrease fall risk.    Time  3    Period  Weeks    Status  New    Target Date  12/17/19        PT Long Term Goals - 11/02/19 1901      PT LONG TERM GOAL #1   Title  Patient will improve bilateral hip extension and abduction strength to improve steadiness with gait, and standing tasks, and decrease fall risk.    Time  8    Period  Weeks    Status  New    Target Date  12/31/19      PT LONG TERM GOAL #2   Title  Patient  will improve her DGI balance score to 19/24 or more as a demonstration of improved balance.    Baseline  13/24 DGI (11/02/2019)    Time  8    Period  Weeks    Status  New    Target Date  12/31/19      PT LONG TERM GOAL #3   Title  Patient will have a decrease in low back pain to 4/10 or less at worst to promote ability to peform standing tasks such as washing dishes or sweeping the floor more comfortably    Baseline  7/10 low back pain at most for the past 3 months (11/02/2019)    Time  8    Period  Weeks    Status  New    Target Date  12/31/19      PT LONG TERM GOAL #4   Title  Pt will be able to step over a shoe box and clear both feet independently at least 10x each LE to improve balance and decrease fall risk.    Baseline  Unable to clear shoe box when stepping over it (11/02/2019).    Time  8    Period  Weeks    Status  New    Target Date  12/31/19      PT LONG TERM GOAL #5   Title  Pt will report decreased to no difficulty with stair negotiation with one UE assist (4 regular steps) to promote ability to get into and out of her house more easily.    Baseline  Difficulty with stair negotiation secondary to fear of falling (11/02/2019)    Time  8    Period  Weeks    Status  New    Target Date  12/31/19            Plan - 11/10/19 1524    Clinical Impression Statement  Pt reported relief with stretches/exercises this session. Very motivated to continue working with PT. Reported 0/10 pain at end of sessoin. Focused on promoting strength to improve functional abilities such as single leg stance stability, core strengthening for posture/lifting/twisting, etc. The patient would benefit from further skilled PT intervention to continue to progress towards goals.    Personal Factors and Comorbidities  Age;Comorbidity 3+;Fitness;Past/Current Experience;Time since onset of injury/illness/exacerbation    Comorbidities  B total shoulder, B total knee replacements, low back pain with  radiating symptoms, L knee pain, neck pain    Examination-Activity Limitations  Lift;Stairs;Carry    Stability/Clinical Decision Making  Evolving/Moderate complexity    Rehab Potential  Fair    PT Frequency  2x / week    PT Duration  8 weeks    PT Treatment/Interventions  Therapeutic activities;Gait training;Stair training;Functional mobility training;Therapeutic exercise;Balance training;Neuromuscular re-education;Patient/family education;Manual techniques;Dry needling;Aquatic Therapy;Canalith Repostioning;Electrical Stimulation;Iontophoresis 4mg /ml Dexamethasone;Vestibular    PT Next Visit Plan  posture, scapular strengthening, thoracic extension, trunk, glute strengthening, femoral control, balance, manual techniques, modalities PRN    Consulted and Agree with Plan of Care  Patient       Patient will benefit from skilled therapeutic intervention in order to improve the following deficits and impairments:  Pain, Postural dysfunction, Improper body mechanics, Difficulty walking, Decreased strength, Decreased balance, Abnormal gait  Visit Diagnosis: Unsteadiness on feet  Difficulty in walking, not elsewhere classified  Muscle weakness (generalized)  History of falling     Problem List Patient Active Problem List   Diagnosis Date Noted  . Influenza A 08/15/2016    Lieutenant Diego PT, DPT 3:58 PM,11/10/19   [facility] PHYSICAL AND SPORTS MEDICINE [address], [place], [address] Phone: [phone]   Fax:  [phone]  Name: Tara Moody MRN: [id number] Date of Birth: November 28, 1945

## 2019-11-12 ENCOUNTER — Other Ambulatory Visit: Payer: Self-pay

## 2019-11-12 ENCOUNTER — Ambulatory Visit: Payer: Medicare Other

## 2019-11-12 DIAGNOSIS — R262 Difficulty in walking, not elsewhere classified: Secondary | ICD-10-CM

## 2019-11-12 DIAGNOSIS — R2681 Unsteadiness on feet: Secondary | ICD-10-CM | POA: Diagnosis not present

## 2019-11-12 DIAGNOSIS — Z9181 History of falling: Secondary | ICD-10-CM

## 2019-11-12 DIAGNOSIS — M6281 Muscle weakness (generalized): Secondary | ICD-10-CM

## 2019-11-12 NOTE — Therapy (Signed)
[facility] [address] [place], [address] Phone: [phone]   Fax:  [phone]  Physical Therapy Treatment  Patient Details  Name: Tenishia Ekman MRN: [id number] Date of Birth: February 27, 1946 Referring Provider (PT): Angelena Form, MD   Encounter Date: 11/12/2019  PT End of Session - 11/12/19 0959    Visit Number  4    Number of Visits  17    Date for PT Re-Evaluation  12/31/19    Authorization Type  4    Authorization Time Period  of 10 progress report    PT Start Time  0959   pt arrived late   PT Stop Time  1035    PT Time Calculation (min)  36 min    Activity Tolerance  Patient tolerated treatment well    Behavior During Therapy  [facility] for tasks assessed/performed       Past Medical History:  Diagnosis Date  . Hypertension     Past Surgical History:  Procedure Laterality Date  . CATARACT EXTRACTION    . REPLACEMENT TOTAL KNEE BILATERAL      There were no vitals filed for this visit.  Subjective Assessment - 11/12/19 1000    Subjective  Balance is good today. Wonders if her balance is related to her inner ear. Has a follow up appointment with Dr. Ola Spurr next Tuesday or Wednesday. No pain currently. Balance was good yesterday until last night when she stood up too fast. Felt unsteady. Felt like she kept going forward. Feel like the room might have been moving. Felt like she did not have equilibrium. Currently out of meclizine.    Pertinent History  Gait abnormality. Pt has had B knee, B shoulder replacements, B carpal tunnel surgeries. Pt hurt her L knee 2001. 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 Husband had a TBI and pt had take care of him until his death. Has had bouts of her L knee hurting. In the last 3 years, pt has been falling more. Fell 5 times in August 2020. Was outside walking the dogs and has had a hard time picking up her feet (has had that). Feels more tired since having COVID.   Has not fallen in the past 2 months because she has been paying attention. Fell up the steps which knocked the wind out of her in 09/2019.  Had COVID end of August/beginning September 2020. Wants to prevent falls. [address] one more time in March 2021 when her dogs (jack russel and beagle; pt was walking them at the same time). Pt was on a road with little traffic but it has hills. Pt did not raise her R foot high enough and tripped on a step when pt fell in February 2021. Wonders if her R foot if shorter. When pt fell in August 2020, pt states that she tripped every time. Pt now able to catch herself when she trips. Kneels on her L knee to stand up from a fall. Pt reports tripping on R foot more than the L.  Pt has a house in [place]. Pt currently living in [place]. Planning on returning to [place] in September 2021. [place]: pt lives alone in a 1 story home. 3 steps front door with B rail, 4 steps back door, B rails. [place] home: 1 story home, 1 small step to enter, no rails (not a problem per pt). Same for back door.    Patient Stated Goals  Be better able  to perform floor to stand transfers.    Currently in Pain?  No/denies    Pain Score  0-No pain    Pain Onset  More than a month ago                               PT Education - 11/12/19 1201    Education Details  ther-ex    Person(s) Educated  Patient    Methods  Explanation;Demonstration;Tactile cues;Verbal cues    Comprehension  Returned demonstration;Verbalized understanding     Objectives   No latex band allergies Blood pressure is controlled per pt.   No B hip surgeries  Pt states she is usually walking when she loses balance.  Gait: increased lumbar rotation   Pt states R LE pain (L5/S1 dermatome from posterior hip to distal leg; comes and goes since 5-6 months ago/acute on chronic). Pt also states having B plantar fasciitis.   Pt also states feeling L lateral thigh pain and crossing her L leg over R  (piriformis stretch) helps.    Pt states having L knee pain. Pt states having neck pain.   No back pain in standing   MedbridgeAccess Code [email or web address]   Therapeutic exercise  Five times sit to stand (quickly) 16 seconds. No sensation if disequilibrium.   Standing 360 degree turns   To the R 3x   Slight disequilibrium. Similar to yesterday but in a milder form    Pt states feeling headaches in her sinuses and temples.    To the L 3x. Reproduced symptoms. Internal sensation of disequilibrium, room does not look like it is spinning per pt.   Neck posture: R latearl shift   Seated manually resisted L cervical lateral shift isometric in neutral 10x5 seconds for 3 sets   Seated manually resisted scapular retraction targeting lower trap muscles  R 10x2 with 5 seconds   L 10x2 with 5 seconds    Seated chin tucks 10x5 seconds  Decreased feeling of disequilibrium with L and R turns after chin tucks       Improved exercise technique, movement at target joints, use of target muscles after mod verbal, visual, tactile cues.     Manual therapy  Seated STM bilatearl cervical paraspinal musclces and B UT muscles to decrease tension      Clinical impression/pt response:  Pt arrived late so session adjusted accordingly. No disequilibrium symptoms with sit <> stands quickly today. Reproduction of unsteadiness with 360 degree turns L > R. Decreased symptoms with chin tucks to promote better neck posture, and promote upper thoracic extension and lower cervical flexion. Pt tolerated session well without aggravation of symptoms. Pt will benefit from continued skilled physical therapy services to decrease pain, improve strength, balance and function.               PT Short Term Goals - 11/02/19 1900      PT SHORT TERM GOAL #1   Title  Pt will be independent with her HEP to improve balance and decrease fall risk.    Time  3    Period  Weeks    Status   New    Target Date  12/17/19        PT Long Term Goals - 11/02/19 1901      PT LONG TERM GOAL #1   Title  Patient will improve bilateral hip extension and abduction strength to improve steadiness with gait, and  standing tasks, and decrease fall risk.    Time  8    Period  Weeks    Status  New    Target Date  12/31/19      PT LONG TERM GOAL #2   Title  Patient will improve her DGI balance score to 19/24 or more as a demonstration of improved balance.    Baseline  13/24 DGI (11/02/2019)    Time  8    Period  Weeks    Status  New    Target Date  12/31/19      PT LONG TERM GOAL #3   Title  Patient will have a decrease in low back pain to 4/10 or less at worst to promote ability to peform standing tasks such as washing dishes or sweeping the floor more comfortably    Baseline  7/10 low back pain at most for the past 3 months (11/02/2019)    Time  8    Period  Weeks    Status  New    Target Date  12/31/19      PT LONG TERM GOAL #4   Title  Pt will be able to step over a shoe box and clear both feet independently at least 10x each LE to improve balance and decrease fall risk.    Baseline  Unable to clear shoe box when stepping over it (11/02/2019).    Time  8    Period  Weeks    Status  New    Target Date  12/31/19      PT LONG TERM GOAL #5   Title  Pt will report decreased to no difficulty with stair negotiation with one UE assist (4 regular steps) to promote ability to get into and out of her house more easily.    Baseline  Difficulty with stair negotiation secondary to fear of falling (11/02/2019)    Time  8    Period  Weeks    Status  New    Target Date  12/31/19            Plan - 11/12/19 1201    Clinical Impression Statement  Pt arrived late so session adjusted accordingly. No disequilibrium symptoms with sit <> stands quickly today. Reproduction of unsteadiness with 360 degree turns L > R. Decreased symptoms with chin tucks to promote better neck posture, and  promote upper thoracic extension and lower cervical flexion. Pt tolerated session well without aggravation of symptoms. Pt will benefit from continued skilled physical therapy services to decrease pain, improve strength, balance and function.    Personal Factors and Comorbidities  Age;Comorbidity 3+;Fitness;Past/Current Experience;Time since onset of injury/illness/exacerbation    Comorbidities  B total shoulder, B total knee replacements, low back pain with radiating symptoms, L knee pain, neck pain    Examination-Activity Limitations  Lift;Stairs;Carry    Stability/Clinical Decision Making  Evolving/Moderate complexity    Rehab Potential  Fair    PT Frequency  2x / week    PT Duration  8 weeks    PT Treatment/Interventions  Therapeutic activities;Gait training;Stair training;Functional mobility training;Therapeutic exercise;Balance training;Neuromuscular re-education;Patient/family education;Manual techniques;Dry needling;Aquatic Therapy;Canalith Repostioning;Electrical Stimulation;Iontophoresis 4mg /ml Dexamethasone;Vestibular    PT Next Visit Plan  posture, scapular strengthening, thoracic extension, trunk, glute strengthening, femoral control, balance, manual techniques, modalities PRN    Consulted and Agree with Plan of Care  Patient       Patient will benefit from skilled therapeutic intervention in order to improve the following deficits and impairments:  Pain, Postural dysfunction, Improper body mechanics, Difficulty walking, Decreased strength, Decreased balance, Abnormal gait  Visit Diagnosis: Unsteadiness on feet  Difficulty in walking, not elsewhere classified  Muscle weakness (generalized)  History of falling     Problem List Patient Active Problem List   Diagnosis Date Noted  . Influenza A 08/15/2016    Loralyn Freshwater PT, DPT   11/12/2019, 12:06 PM  [facility] [address], [place],  [address] Phone: [phone]   Fax:  [phone]  Name: Sierrah Luevano MRN: [id number] Date of Birth: 1945/10/12

## 2019-11-16 ENCOUNTER — Ambulatory Visit: Payer: Medicare Other

## 2019-11-16 ENCOUNTER — Other Ambulatory Visit: Payer: Self-pay

## 2019-11-16 DIAGNOSIS — R262 Difficulty in walking, not elsewhere classified: Secondary | ICD-10-CM

## 2019-11-16 DIAGNOSIS — M6281 Muscle weakness (generalized): Secondary | ICD-10-CM

## 2019-11-16 DIAGNOSIS — R2681 Unsteadiness on feet: Secondary | ICD-10-CM

## 2019-11-16 DIAGNOSIS — Z9181 History of falling: Secondary | ICD-10-CM

## 2019-11-16 NOTE — Therapy (Signed)
[facility] [address] [place], [address] Phone: [phone]   Fax:  [phone]  Physical Therapy Treatment  Patient Details  Name: Tara Moody MRN: [id number] Date of Birth: 1946-07-03 Referring Provider (PT): Angelena Form, MD   Encounter Date: 11/16/2019  PT End of Session - 11/16/19 1042    Visit Number  5    Number of Visits  17    Date for PT Re-Evaluation  12/31/19    Authorization Type  5    Authorization Time Period  of 10 progress report    PT Start Time  1829    PT Stop Time  1118    PT Time Calculation (min)  35 min    Activity Tolerance  Patient tolerated treatment well    Behavior During Therapy  [facility] for tasks assessed/performed       Past Medical History:  Diagnosis Date  . Hypertension     Past Surgical History:  Procedure Laterality Date  . CATARACT EXTRACTION    . REPLACEMENT TOTAL KNEE BILATERAL      There were no vitals filed for this visit.  Subjective Assessment - 11/16/19 1044    Subjective  Has been working on her balance. Feels like she is getting better. Did a lot last night, moving stuff from one house to the other. It always wears her out. Was dizzy yesterday. Has a headache which comes and goes. Has some back discomfort in which putting her hand in her back helps it feel better. 2/10 back pain currently. Not bothering her. Has a little bit of a headache at her temporal area.    Pertinent History  Gait abnormality. Pt has had B knee, B shoulder replacements, B carpal tunnel surgeries. Pt hurt her L knee 2001. 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 Husband had a TBI and pt had take care of him until his death. Has had bouts of her L knee hurting. In the last 3 years, pt has been falling more. Fell 5 times in August 2020. Was outside walking the dogs and has had a hard time picking up her feet (has had that). Feels more tired since having COVID.  Has not fallen in  the past 2 months because she has been paying attention. Fell up the steps which knocked the wind out of her in 09/2019.  Had COVID end of August/beginning September 2020. Wants to prevent falls. [place] [address] one more time in March 2021 when her dogs (jack russel and beagle; pt was walking them at the same time). Pt was on a road with little traffic but it has hills. Pt did not raise her R foot high enough and tripped on a step when pt fell in February 2021. Wonders if her R foot if shorter. When pt fell in August 2020, pt states that she tripped every time. Pt now able to catch herself when she trips. Kneels on her L knee to stand up from a fall. Pt reports tripping on R foot more than the L.  Pt has a house in [place]. Pt currently living in [place]. Planning on returning to [place] in September 2021. [place]: pt lives alone in a 1 story home. 3 steps front door with B rail, 4 steps back door, B rails. [place] home: 1 story home, 1 small step to enter, no rails (not a problem per pt). Same for back door.    Patient Stated Goals  Be better  able to perform floor to stand transfers.    Currently in Pain?  Yes    Pain Score  2     Pain Location  Back    Pain Onset  More than a month ago                               PT Education - 11/16/19 1055    Education Details  ther-ex    Person(s) Educated  Patient    Methods  Explanation;Demonstration;Tactile cues;Verbal cues    Comprehension  Returned demonstration;Verbalized understanding        Objectives  No latex band allergies Blood pressure is controlled per pt.   No B hip surgeries  Pt states she is usually walking when she loses balance.  Gait: increased lumbar rotation   Pt states R LE pain (L5/S1 dermatome from posterior hip to distal leg; comes and goes since 5-6 months ago/acute on chronic). Pt also states having B plantar fasciitis.   Pt also states feeling L lateral thigh pain and crossing her L leg over R  (piriformis stretch) helps.    Pt states having L knee pain. Pt states having neck pain.   No back pain in standing   MedbridgeAccess Code [email or web address]   Therapeutic exercise    Standing 360 degree turns              To the R 3x                       only minimal unsteadiness                                  To the L 3x.    Only minimal unsteadiness sensation   Seated chin tucks 10x5 seconds for 3 sets  Seated R scapular depression isometrics, R forearm at arm rest 10x5 seconds for 3 sets  Standing B scapular retraction red band 10x5 seconds for 3 sets  Feels good everywhere per pt   Standing 360 degree turns               To the R 3x                                                        To the L 3x.      Overall more steady compared to initial set.       Improved exercise technique, movement at target joints, use of target muscles after mod verbal, visual, tactile cues.     Manual therapy  Seated STM cervical paraspinal muscles L > R to decrease tension   More steady with 360 degree turns both directions   Clinical impression/pt response:   Pt arrived late so session adjusted accordingly. Decreased unsteadiness observed after treatment to decrease neck stiffness.  Pt will benefit from continued skilled physical therapy services to decrease stiffness, pain, improve strength, balance, steadiness, and decrease fall risk.      PT Short Term Goals - 11/02/19 1900      PT SHORT TERM GOAL #1   Title  Pt will be independent with her HEP to improve balance and decrease fall risk.  Time  3    Period  Weeks    Status  New    Target Date  12/17/19        PT Long Term Goals - 11/02/19 1901      PT LONG TERM GOAL #1   Title  Patient will improve bilateral hip extension and abduction strength to improve steadiness with gait, and standing tasks, and decrease fall risk.    Time  8    Period  Weeks    Status  New    Target  Date  12/31/19      PT LONG TERM GOAL #2   Title  Patient will improve her DGI balance score to 19/24 or more as a demonstration of improved balance.    Baseline  13/24 DGI (11/02/2019)    Time  8    Period  Weeks    Status  New    Target Date  12/31/19      PT LONG TERM GOAL #3   Title  Patient will have a decrease in low back pain to 4/10 or less at worst to promote ability to peform standing tasks such as washing dishes or sweeping the floor more comfortably    Baseline  7/10 low back pain at most for the past 3 months (11/02/2019)    Time  8    Period  Weeks    Status  New    Target Date  12/31/19      PT LONG TERM GOAL #4   Title  Pt will be able to step over a shoe box and clear both feet independently at least 10x each LE to improve balance and decrease fall risk.    Baseline  Unable to clear shoe box when stepping over it (11/02/2019).    Time  8    Period  Weeks    Status  New    Target Date  12/31/19      PT LONG TERM GOAL #5   Title  Pt will report decreased to no difficulty with stair negotiation with one UE assist (4 regular steps) to promote ability to get into and out of her house more easily.    Baseline  Difficulty with stair negotiation secondary to fear of falling (11/02/2019)    Time  8    Period  Weeks    Status  New    Target Date  12/31/19            Plan - 11/16/19 1042    Clinical Impression Statement  Pt arrived late so session adjusted accordingly. Decreased unsteadiness observed after treatment to decrease neck stiffness.  Pt will benefit from continued skilled physical therapy services to decrease stiffness, pain, improve strength, balance, steadiness, and decrease fall risk.    Personal Factors and Comorbidities  Age;Comorbidity 3+;Fitness;Past/Current Experience;Time since onset of injury/illness/exacerbation    Comorbidities  B total shoulder, B total knee replacements, low back pain with radiating symptoms, L knee pain, neck pain     Examination-Activity Limitations  Lift;Stairs;Carry    Stability/Clinical Decision Making  Evolving/Moderate complexity    Rehab Potential  Fair    PT Frequency  2x / week    PT Duration  8 weeks    PT Treatment/Interventions  Therapeutic activities;Gait training;Stair training;Functional mobility training;Therapeutic exercise;Balance training;Neuromuscular re-education;Patient/family education;Manual techniques;Dry needling;Aquatic Therapy;Canalith Repostioning;Electrical Stimulation;Iontophoresis 4mg /ml Dexamethasone;Vestibular    PT Next Visit Plan  posture, scapular strengthening, thoracic extension, trunk, glute strengthening, femoral control, balance, manual techniques, modalities PRN  Consulted and Agree with Plan of Care  Patient       Patient will benefit from skilled therapeutic intervention in order to improve the following deficits and impairments:  Pain, Postural dysfunction, Improper body mechanics, Difficulty walking, Decreased strength, Decreased balance, Abnormal gait  Visit Diagnosis: Unsteadiness on feet  Difficulty in walking, not elsewhere classified  Muscle weakness (generalized)  History of falling     Problem List Patient Active Problem List   Diagnosis Date Noted  . Influenza A 08/15/2016    Loralyn Freshwater PT, DPT   11/16/2019, 12:26 PM  [place] [facility] [address] [place], [address] Phone: [phone]   Fax:  [phone]  Name: Tara Moody MRN: [id number] Date of Birth: 1946/07/09

## 2019-11-19 ENCOUNTER — Ambulatory Visit: Payer: Medicare Other

## 2019-11-19 ENCOUNTER — Other Ambulatory Visit: Payer: Self-pay

## 2019-11-19 DIAGNOSIS — M6281 Muscle weakness (generalized): Secondary | ICD-10-CM

## 2019-11-19 DIAGNOSIS — R2681 Unsteadiness on feet: Secondary | ICD-10-CM | POA: Diagnosis not present

## 2019-11-19 DIAGNOSIS — R262 Difficulty in walking, not elsewhere classified: Secondary | ICD-10-CM

## 2019-11-19 DIAGNOSIS — Z9181 History of falling: Secondary | ICD-10-CM

## 2019-11-19 NOTE — Therapy (Signed)
[facility] [address], [place], [address] Phone: [phone]   Fax:  [phone]  Physical Therapy Treatment  Patient Details  Name: Tara Moody MRN: [id number] Date of Birth: 18-Oct-1945 Referring Provider (PT): Dierdre Harness, MD   Encounter Date: 11/19/2019  PT End of Session - 11/19/19 1622    Visit Number  6    Number of Visits  17    Date for PT Re-Evaluation  12/31/19    Authorization Type  6    Authorization Time Period  of 10 progress report    PT Start Time  1622   pt arrived late   PT Stop Time  1657    PT Time Calculation (min)  35 min    Activity Tolerance  Patient tolerated treatment well    Behavior During Therapy  [facility] for tasks assessed/performed       Past Medical History:  Diagnosis Date  . Hypertension     Past Surgical History:  Procedure Laterality Date  . CATARACT EXTRACTION    . REPLACEMENT TOTAL KNEE BILATERAL      There were no vitals filed for this visit.  Subjective Assessment - 11/19/19 1624    Subjective  Balance is good today but had some difficulty wiht balance during the evening. The unsteadiness feels like it is inside of her. She does not see the room moving. Feels more more steady with manual therapy to the neck.  Feels a dull headache at her temples. Wakes up with posterior headaches.    Pertinent History  Gait abnormality. Pt has had B knee, B shoulder replacements, B carpal tunnel surgeries. Pt hurt her L knee 2001. 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 Husband had a TBI and pt had take care of him until his death. Has had bouts of her L knee hurting. In the last 3 years, pt has been falling more. Fell 5 times in August 2020. Was outside walking the dogs and has had a hard time picking up her feet (has had that). Feels more tired since having COVID.  Has not fallen in the past 2 months because she has been paying attention. Fell up the steps which knocked  the wind out of her in 09/2019.  Had COVID end of August/beginning September 2020. Wants to prevent falls. [place] one more time in March 2021 when her dogs (jack russel and beagle; pt was walking them at the same time). Pt was on a road with little traffic but it has hills. Pt did not raise her R foot high enough and tripped on a step when pt fell in February 2021. Wonders if her R foot if shorter. When pt fell in August 2020, pt states that she tripped every time. Pt now able to catch herself when she trips. Kneels on her L knee to stand up from a fall. Pt reports tripping on R foot more than the L.  Pt has a house in [place]. Pt currently living in [place]. Planning on returning to [place] in September 2021. [place]: pt lives alone in a 1 story home. 3 steps front door with B rail, 4 steps back door, B rails. [place] home: 1 story home, 1 small step to enter, no rails (not a problem per pt). Same for back door.    Patient Stated Goals  Be better able to perform floor to stand transfers.    Currently in Pain?  No/denies  Pain Score  0-No pain    Pain Onset  More than a month ago                               PT Education - 11/19/19 1654    Education Details  ther-ex    Person(s) Educated  Patient    Methods  Explanation;Demonstration;Tactile cues;Verbal cues    Comprehension  Returned demonstration;Verbalized understanding      Objectives  No latex band allergies Blood pressure is controlled per pt.   No B hip surgeries  Pt states she is usually walking when she loses balance.  Gait: increased lumbar rotation   Pt states R LE pain (L5/S1 dermatome from posterior hip to distal leg; comes and goes since 5-6 months ago/acute on chronic). Pt also states having B plantar fasciitis.   Pt also states feeling L lateral thigh pain and crossing her L leg over R (piriformis stretch) helps.    Pt states having L knee pain. Pt states having neck pain.    No back pain in standing   MedbridgeAccess Code [email or web address]    Manual therapy  Supine STM cervical paraspinal muscles to decrease tension            Therapeutic exercise    Supine with pressing tongue to roof of mouth to activate anterior cervical muscles  cervical nodding 1 min x 3  Cervical rotation R and L 1 minute x 3   Supine chin tucks 10x5 seconds for 2 sets  Then with scapular retraction 10x5 seconds   Standing 360 degree turns   To the R 3x   To the L 3x.    Slight sensation of unsteadiness   Check inner ear next session if appropriate.     Improved exercise technique, movement at target joints, use of target muscles after mod verbal, visual, tactile cues.     Clinical impression/pt response: Pt arrived to session late so session adjusted accordingly. Difficulty with chin tuck motor control. No change in balance sensation after working on her neck to decrease stiffness today. Pt tolerated session well without aggravation of symptoms. Pt will benefit from continued skilled physical therapy services to improve balance, strength, function.           \ PT Short Term Goals - 11/02/19 1900      PT SHORT TERM GOAL #1   Title  Pt will be independent with her HEP to improve balance and decrease fall risk.    Time  3    Period  Weeks    Status  New    Target Date  12/17/19        PT Long Term Goals - 11/02/19 1901      PT LONG TERM GOAL #1   Title  Patient will improve bilateral hip extension and abduction strength to improve steadiness with gait, and standing tasks, and decrease fall risk.    Time  8    Period  Weeks    Status  New    Target Date  12/31/19      PT LONG TERM GOAL #2   Title  Patient will improve her DGI balance score to 19/24 or more as a demonstration of improved balance.    Baseline  13/24 DGI (11/02/2019)    Time  8    Period  Weeks     Status  New    Target Date  12/31/19  PT LONG TERM GOAL #3   Title  Patient will have a decrease in low back pain to 4/10 or less at worst to promote ability to peform standing tasks such as washing dishes or sweeping the floor more comfortably    Baseline  7/10 low back pain at most for the past 3 months (11/02/2019)    Time  8    Period  Weeks    Status  New    Target Date  12/31/19      PT LONG TERM GOAL #4   Title  Pt will be able to step over a shoe box and clear both feet independently at least 10x each LE to improve balance and decrease fall risk.    Baseline  Unable to clear shoe box when stepping over it (11/02/2019).    Time  8    Period  Weeks    Status  New    Target Date  12/31/19      PT LONG TERM GOAL #5   Title  Pt will report decreased to no difficulty with stair negotiation with one UE assist (4 regular steps) to promote ability to get into and out of her house more easily.    Baseline  Difficulty with stair negotiation secondary to fear of falling (11/02/2019)    Time  8    Period  Weeks    Status  New    Target Date  12/31/19            Plan - 11/19/19 1859    Clinical Impression Statement  Pt arrived to session late so session adjusted accordingly. Difficulty with chin tuck motor control. No change in balance sensation after working on her neck to decrease stiffness today. Pt tolerated session well without aggravation of symptoms. Pt will benefit from continued skilled physical therapy services to improve balance, strength, function.    Personal Factors and Comorbidities  Age;Comorbidity 3+;Fitness;Past/Current Experience;Time since onset of injury/illness/exacerbation    Comorbidities  B total shoulder, B total knee replacements, low back pain with radiating symptoms, L knee pain, neck pain    Examination-Activity Limitations  Lift;Stairs;Carry    Stability/Clinical Decision Making  Evolving/Moderate complexity    Rehab Potential  Fair    PT Frequency   2x / week    PT Duration  8 weeks    PT Treatment/Interventions  Therapeutic activities;Gait training;Stair training;Functional mobility training;Therapeutic exercise;Balance training;Neuromuscular re-education;Patient/family education;Manual techniques;Dry needling;Aquatic Therapy;Canalith Repostioning;Electrical Stimulation;Iontophoresis 4mg /ml Dexamethasone;Vestibular    PT Next Visit Plan  posture, scapular strengthening, thoracic extension, trunk, glute strengthening, femoral control, balance, manual techniques, modalities PRN    Consulted and Agree with Plan of Care  Patient       Patient will benefit from skilled therapeutic intervention in order to improve the following deficits and impairments:  Pain, Postural dysfunction, Improper body mechanics, Difficulty walking, Decreased strength, Decreased balance, Abnormal gait  Visit Diagnosis: Unsteadiness on feet  Difficulty in walking, not elsewhere classified  Muscle weakness (generalized)  History of falling     Problem List Patient Active Problem List   Diagnosis Date Noted  . Influenza A 08/15/2016    Joneen Boers PT, DPT   11/19/2019, 7:03 PM  [facility] [address] [place], [address] Phone: [phone]   Fax:  [phone]  Name: Tara Moody MRN: [id number] Date of Birth: 08/20/1945

## 2019-11-25 ENCOUNTER — Ambulatory Visit: Payer: Medicare Other

## 2019-11-25 ENCOUNTER — Other Ambulatory Visit: Payer: Self-pay

## 2019-11-25 DIAGNOSIS — Z9181 History of falling: Secondary | ICD-10-CM

## 2019-11-25 DIAGNOSIS — R262 Difficulty in walking, not elsewhere classified: Secondary | ICD-10-CM

## 2019-11-25 DIAGNOSIS — R2681 Unsteadiness on feet: Secondary | ICD-10-CM | POA: Diagnosis not present

## 2019-11-25 DIAGNOSIS — M6281 Muscle weakness (generalized): Secondary | ICD-10-CM

## 2019-11-25 NOTE — Therapy (Signed)
[place] [facility] [address] [place], [address] Phone: [phone]   Fax:  [phone]  Physical Therapy Treatment  Patient Details  Name: Tara Moody MRN: [id number] Date of Birth: 21-Apr-1946 Referring Provider (PT): Angelena Form, MD   Encounter Date: 11/25/2019  PT End of Session - 11/25/19 1649    Visit Number  7    Number of Visits  17    Date for PT Re-Evaluation  12/31/19    Authorization Type  7    Authorization Time Period  of 10 progress report    PT Start Time  1649    PT Stop Time  1731    PT Time Calculation (min)  42 min    Activity Tolerance  Patient tolerated treatment well    Behavior During Therapy  [facility] for tasks assessed/performed       Past Medical History:  Diagnosis Date  . Hypertension     Past Surgical History:  Procedure Laterality Date  . CATARACT EXTRACTION    . REPLACEMENT TOTAL KNEE BILATERAL      There were no vitals filed for this visit.  Subjective Assessment - 11/25/19 1650    Subjective  Balance is pretty good. A little better she thinks. Walking the dogs, washing clothes gets her back tired. A little dizziness, better but not gone. 2-3/10 back pain at most for the past 7 days.    Pertinent History  Gait abnormality. Pt has had B knee, B shoulder replacements, B carpal tunnel surgeries. Pt hurt her L knee 2001. 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 Husband had a TBI and pt had take care of him until his death. Has had bouts of her L knee hurting. In the last 3 years, pt has been falling more. Fell 5 times in August 2020. Was outside walking the dogs and has had a hard time picking up her feet (has had that). Feels more tired since having COVID.  Has not fallen in the past 2 months because she has been paying attention. Fell up the steps which knocked the wind out of her in 09/2019.  Had COVID end of August/beginning September 2020. Wants to prevent falls. [place] one  more time in March 2021 when her dogs (jack russel and beagle; pt was walking them at the same time). Pt was on a road with little traffic but it has hills. Pt did not raise her R foot high enough and tripped on a step when pt fell in February 2021. Wonders if her R foot if shorter. When pt fell in August 2020, pt states that she tripped every time. Pt now able to catch herself when she trips. Kneels on her L knee to stand up from a fall. Pt reports tripping on R foot more than the L.  Pt has a house in [place]. Pt currently living in [place]. Planning on returning to [place] in September 2021. [place]: pt lives alone in a 1 story home. 3 steps front door with B rail, 4 steps back door, B rails. [place] home: 1 story home, 1 small step to enter, no rails (not a problem per pt). Same for back door.    Patient Stated Goals  Be better able to perform floor to stand transfers.    Currently in Pain?  Other (Comment)   Vague back discomfort   Pain Onset  More than a month ago  PT Education - 11/25/19 1723    Education Details  ther-ex    Person(s) Educated  Patient    Methods  Explanation;Demonstration;Tactile cues;Verbal cues    Comprehension  Returned demonstration;Verbalized understanding      Objectives  No latex band allergies Blood pressure is controlled per pt.   No B hip surgeries  Pt states she is usually walking when she loses balance.  Gait: increased lumbar rotation   Pt states R LE pain (L5/S1 dermatome from posterior hip to distal leg; comes and goes since 5-6 months ago/acute on chronic). Pt also states having B plantar fasciitis.   Pt also states feeling L lateral thigh pain and crossing her L leg over R (piriformis stretch) helps.    Pt states having L knee pain. Pt states having neck pain.   No back pain in standing   MedbridgeAccess Code [id number]  Gait: decreased stance L LE       Therapeutic exercise   Stepping over 4 mini hurdles 6x, cues for increasing hip and knee flexion for foot clearance  glute med weakness observed L > R  S/L hip abduction   R 10x3. Low back discomfort, eases with rest  L 10x3  Good glute med muscle use felt   Supine posterior pelvic tilt to promote low back stability   PT assist for proper movement instead of bridging (roll pelvis back)  10x3 with 5 second holds.   No R posterior thigh pain  Hooklying R and L manual lower trunk rotation perturbation isometrics 10x3 each way to promote low back stability strength  Demonstrates difficulty with core strength.   Supine clamshell red 10x3 to promote glute med muscle strength  Seated chin tucks 10x5 seconds to promote upper thoracic extension decrease lower cervical extension stress, improve anterior cervical muscle activation, decrease cervical paraspinal muscle tension.    Reviewed HEP. Handout reprinted for pt secondary to pt currently not performing them at home. Pt demonstrated and verbalized undrstanding   Improved exercise technique, movement at target joints, use of target muscles after mod verbal, visual, tactile cues.    Clinical impression/pt response: Pt demonstrates poor core and glute strength observed during gait and exercises. Worked on improving strength to decrease stress to low back as well as to improve steadiness with gait and improve balance. Pt also demonstrates overall decreased low back pain based on subjective reports. Pt tolerated session well without aggravation of symptoms. Pt will benefit from continued skilled physical therapy services to decrease pain, improve strength, stability, balance, and function.       PT Short Term Goals - 11/02/19 1900      PT SHORT TERM GOAL #1   Title  Pt will be independent with her HEP to improve balance and decrease fall risk.    Time  3    Period  Weeks    Status  New    Target Date   12/17/19        PT Long Term Goals - 11/02/19 1901      PT LONG TERM GOAL #1   Title  Patient will improve bilateral hip extension and abduction strength to improve steadiness with gait, and standing tasks, and decrease fall risk.    Time  8    Period  Weeks    Status  New    Target Date  12/31/19      PT LONG TERM GOAL #2   Title  Patient will improve her DGI balance score to 19/24 or  more as a demonstration of improved balance.    Baseline  13/24 DGI (11/02/2019)    Time  8    Period  Weeks    Status  New    Target Date  12/31/19      PT LONG TERM GOAL #3   Title  Patient will have a decrease in low back pain to 4/10 or less at worst to promote ability to peform standing tasks such as washing dishes or sweeping the floor more comfortably    Baseline  7/10 low back pain at most for the past 3 months (11/02/2019)    Time  8    Period  Weeks    Status  New    Target Date  12/31/19      PT LONG TERM GOAL #4   Title  Pt will be able to step over a shoe box and clear both feet independently at least 10x each LE to improve balance and decrease fall risk.    Baseline  Unable to clear shoe box when stepping over it (11/02/2019).    Time  8    Period  Weeks    Status  New    Target Date  12/31/19      PT LONG TERM GOAL #5   Title  Pt will report decreased to no difficulty with stair negotiation with one UE assist (4 regular steps) to promote ability to get into and out of her house more easily.    Baseline  Difficulty with stair negotiation secondary to fear of falling (11/02/2019)    Time  8    Period  Weeks    Status  New    Target Date  12/31/19            Plan - 11/25/19 1646    Clinical Impression Statement  Pt demonstrates poor core and glute strength observed during gait and exercises. Worked on improving strength to decrease stress to low back as well as to improve steadiness with gait and improve balance. Pt also demonstrates overall decreased low back pain based on  subjective reports. Pt tolerated session well without aggravation of symptoms. Pt will benefit from continued skilled physical therapy services to decrease pain, improve strength, stability, balance, and function.    Personal Factors and Comorbidities  Age;Comorbidity 3+;Fitness;Past/Current Experience;Time since onset of injury/illness/exacerbation    Comorbidities  B total shoulder, B total knee replacements, low back pain with radiating symptoms, L knee pain, neck pain    Examination-Activity Limitations  Lift;Stairs;Carry    Stability/Clinical Decision Making  Evolving/Moderate complexity    Rehab Potential  Fair    PT Frequency  2x / week    PT Duration  8 weeks    PT Treatment/Interventions  Therapeutic activities;Gait training;Stair training;Functional mobility training;Therapeutic exercise;Balance training;Neuromuscular re-education;Patient/family education;Manual techniques;Dry needling;Aquatic Therapy;Canalith Repostioning;Electrical Stimulation;Iontophoresis 4mg /ml Dexamethasone;Vestibular    PT Next Visit Plan  posture, scapular strengthening, thoracic extension, trunk, glute strengthening, femoral control, balance, manual techniques, modalities PRN    Consulted and Agree with Plan of Care  Patient       Patient will benefit from skilled therapeutic intervention in order to improve the following deficits and impairments:  Pain, Postural dysfunction, Improper body mechanics, Difficulty walking, Decreased strength, Decreased balance, Abnormal gait  Visit Diagnosis: Unsteadiness on feet  Difficulty in walking, not elsewhere classified  Muscle weakness (generalized)  History of falling     Problem List Patient Active Problem List   Diagnosis Date Noted  . Influenza A  08/15/2016    Loralyn Freshwater PT, DPT   11/25/2019, 6:47 PM   [facility]IC[facility] [address], [place], [address] Phone: [phone]   Fax:   [phone]  Name: Tara Moody MRN: [id number] Date of Birth: November 21, 1945

## 2019-12-01 ENCOUNTER — Other Ambulatory Visit: Payer: Self-pay

## 2019-12-01 ENCOUNTER — Ambulatory Visit: Payer: Medicare Other

## 2019-12-01 DIAGNOSIS — R2681 Unsteadiness on feet: Secondary | ICD-10-CM

## 2019-12-01 DIAGNOSIS — R262 Difficulty in walking, not elsewhere classified: Secondary | ICD-10-CM

## 2019-12-01 DIAGNOSIS — Z9181 History of falling: Secondary | ICD-10-CM

## 2019-12-01 DIAGNOSIS — M6281 Muscle weakness (generalized): Secondary | ICD-10-CM

## 2019-12-01 NOTE — Therapy (Signed)
[facility] [address], [place], [address] Phone: [phone]   Fax:  [phone]  Physical Therapy Treatment  Patient Details  Name: Tara Moody MRN: [id number] Date of Birth: Jun 06, 1946 Referring Provider (PT): Angelena Form, MD   Encounter Date: 12/01/2019  PT End of Session - 12/01/19 1528    Visit Number  8    Number of Visits  17    Date for PT Re-Evaluation  12/31/19    Authorization Type  8    Authorization Time Period  of 10 progress report    PT Start Time  1528   pt arrived late   PT Stop Time  1618    PT Time Calculation (min)  50 min    Activity Tolerance  Patient tolerated treatment well    Behavior During Therapy  [facility] for tasks assessed/performed       Past Medical History:  Diagnosis Date  . Hypertension     Past Surgical History:  Procedure Laterality Date  . CATARACT EXTRACTION    . REPLACEMENT TOTAL KNEE BILATERAL      There were no vitals filed for this visit.  Subjective Assessment - 12/01/19 1529    Subjective  Doing pretty good. Walking and balance she thinks its better. Has a problem in her R heel. Thinks she has plantar fasciitis. 5/10 R heel pain when walking. Has inserts which help but they are in [place]. No falls recently.  1-2/10 low back pain at most for the past 7 days.  Going up and down steps is still hard.  Has a hard time negotiating stairs while carrying something (descending is more difficult than ascending stairs)    Pertinent History  Gait abnormality. Pt has had B knee, B shoulder replacements, B carpal tunnel surgeries. Pt hurt her L knee 2001. 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 Husband had a TBI and pt had take care of him until his death. Has had bouts of her L knee hurting. In the last 3 years, pt has been falling more. Fell 5 times in August 2020. Was outside walking the dogs and has had a hard time picking up her feet (has had that). Feels  more tired since having COVID.  Has not fallen in the past 2 months because she has been paying attention. Fell up the steps which knocked the wind out of her in 09/2019.  Had COVID end of August/beginning September 2020. Wants to prevent falls. [address] one more time in March 2021 when her dogs (jack russel and beagle; pt was walking them at the same time). Pt was on a road with little traffic but it has hills. Pt did not raise her R foot high enough and tripped on a step when pt fell in February 2021. Wonders if her R foot if shorter. When pt fell in August 2020, pt states that she tripped every time. Pt now able to catch herself when she trips. Kneels on her L knee to stand up from a fall. Pt reports tripping on R foot more than the L.  Pt has a house in [place]. Pt currently living in [place]. Planning on returning to [place] in September 2021. [place]: pt lives alone in a 1 story home. 3 steps front door with B rail, 4 steps back door, B rails. [place] home: 1 story home, 1 small step to enter, no rails (not a problem per pt). Same for back door.  Patient Stated Goals  Be better able to perform floor to stand transfers.    Currently in Pain?  Yes    Pain Score  5     Pain Location  Heel    Pain Orientation  Right    Pain Onset  More than a month ago         [facility] PT Assessment - 12/01/19 1614      Strength   Right Hip Extension  4/5   seated manually resisted   Right Hip ABduction  4+/5   seated manually resisted clamshell   Left Hip Extension  4/5   seated manually resisted   Left Hip ABduction  4+/5   seated manually resisted clamshell                          PT Education - 12/01/19 1617    Education Details  ther-ex    Person(s) Educated  Patient    Methods  Explanation;Demonstration;Tactile cues;Verbal cues    Comprehension  Returned demonstration;Verbalized understanding      Objectives  No latex band allergies Blood pressure is controlled per pt.    No B hip surgeries  Pt states she is usually walking when she loses balance.  Gait: increased lumbar rotation   Pt states R LE pain (L5/S1 dermatome from posterior hip to distal leg; comes and goes since 5-6 months ago/acute on chronic). Pt also states having B plantar fasciitis.   Pt also states feeling L lateral thigh pain and crossing her L leg over R (piriformis stretch) helps.    Pt states having L knee pain. Pt states having neck pain.   No back pain in standing   MedbridgeAccess Code [email or web address]  Gait: decreased stance L LE     Manual therapy  Seated STM R  Medial and lateral calcaneus  Heel  plantar foot, arch, lateral plantar surface  Decreased R heel pain to 3-4/10 during gait afterwards    Therapeutic exercise  Manually resisted eccentric plantar flexion 10x3 to promote eccentric loading to achilles and tibialis posterior tendons. Felt good for arch reported by pt  Stair negotiation 4 regular steps with B UE assist 3x. No problem  Then with one rail assist and carrying 5 lbs in R hand. No problem   Then no weight but no UE assist. No problem going up. LOB going down, PT min A to recover. Pt demonstrated quad and glute med weakness.   Seated manually resisted hip extension, and clamshell isometrics 1-2x each way for each LE  Reviewed progress with hip strength with pt.    Improved exercise technique, movement at target joints, use of target muscles after mod verbal, visual, tactile cues.    Clinical impression/pt response: Pt demonstrates improved bilateral hip extension and abduction strength as well as decreased low back pain since initial evaluation. Pt demonstrates R heel pain at start of session which decreased after treatment to promote fascial mobility at and around that area followed by eccentric loading to her Achilles and tibialis posterior tendon. Worked on decreasing heel pain to improve ability to  place weight onto R foot and improve balance.  Pt tolerated session well without aggravation of symptoms. Pt will benefit from continued skilled physical therapy services to decrease pain, improve strength, balance, function, and decrease fall risk.       PT Short Term Goals - 11/02/19 1900      PT SHORT TERM GOAL #1  Title  Pt will be independent with her HEP to improve balance and decrease fall risk.    Time  3    Period  Weeks    Status  New    Target Date  12/17/19        PT Long Term Goals - 12/01/19 1627      PT LONG TERM GOAL #1   Title  Patient will improve bilateral hip extension and abduction strength to improve steadiness with gait, and standing tasks, and decrease fall risk.    Time  8    Period  Weeks    Status  Achieved    Target Date  12/31/19      PT LONG TERM GOAL #2   Title  Patient will improve her DGI balance score to 19/24 or more as a demonstration of improved balance.    Baseline  13/24 DGI (11/02/2019)    Time  8    Period  Weeks    Status  On-going    Target Date  12/31/19      PT LONG TERM GOAL #3   Title  Patient will have a decrease in low back pain to 4/10 or less at worst to promote ability to peform standing tasks such as washing dishes or sweeping the floor more comfortably    Baseline  7/10 low back pain at most for the past 3 months (11/02/2019); 2/10 low back pain at most for the past 7 days (12/01/2019)    Time  8    Period  Weeks    Status  Achieved    Target Date  12/31/19      PT LONG TERM GOAL #4   Title  Pt will be able to step over a shoe box and clear both feet independently at least 10x each LE to improve balance and decrease fall risk.    Baseline  Unable to clear shoe box when stepping over it (11/02/2019).    Time  8    Period  Weeks    Status  On-going    Target Date  12/31/19      PT LONG TERM GOAL #5   Title  Pt will report decreased to no difficulty with stair negotiation with one UE assist (4 regular steps) to  promote ability to get into and out of her house more easily.    Baseline  Difficulty with stair negotiation secondary to fear of falling (11/02/2019); difficulty secondary to fear of falling. No difficulty wiht B UE assist. Difficulty when carrying something without UE assist (12/01/2019)    Time  8    Period  Weeks    Status  Partially Met    Target Date  12/31/19            Plan - 12/01/19 1627    Clinical Impression Statement  Pt demonstrates improved bilateral hip extension and abduction strength as well as decreased low back pain since initial evaluation. Pt demonstrates R heel pain at start of session which decreased after treatment to promote fascial mobility at and around that area followed by eccentric loading to her Achilles and tibialis posterior tendon. Worked on decreasing heel pain to improve ability to place weight onto R foot and improve balance.  Pt tolerated session well without aggravation of symptoms. Pt will benefit from continued skilled physical therapy services to decrease pain, improve strength, balance, function, and decrease fall risk.    Personal Factors and Comorbidities  Age;Comorbidity 3+;Fitness;Past/Current Experience;Time since onset of  injury/illness/exacerbation    Comorbidities  B total shoulder, B total knee replacements, low back pain with radiating symptoms, L knee pain, neck pain    Examination-Activity Limitations  Lift;Stairs;Carry    Stability/Clinical Decision Making  Evolving/Moderate complexity    Rehab Potential  Fair    PT Frequency  2x / week    PT Duration  8 weeks    PT Treatment/Interventions  Therapeutic activities;Gait training;Stair training;Functional mobility training;Therapeutic exercise;Balance training;Neuromuscular re-education;Patient/family education;Manual techniques;Dry needling;Aquatic Therapy;Canalith Repostioning;Electrical Stimulation;Iontophoresis 67m/ml Dexamethasone;Vestibular    PT Next Visit Plan  posture, scapular  strengthening, thoracic extension, trunk, glute strengthening, femoral control, balance, manual techniques, modalities PRN    Consulted and Agree with Plan of Care  Patient       Patient will benefit from skilled therapeutic intervention in order to improve the following deficits and impairments:  Pain, Postural dysfunction, Improper body mechanics, Difficulty walking, Decreased strength, Decreased balance, Abnormal gait  Visit Diagnosis: Unsteadiness on feet  Difficulty in walking, not elsewhere classified  Muscle weakness (generalized)  History of falling     Problem List Patient Active Problem List   Diagnosis Date Noted  . Influenza A 08/15/2016    MJoneen BoersPT, DPT   12/01/2019, 4:42 PM  [facility] [address] [place] [address]Phone: [phone]  Fax:  [phone] Name: BEyana StolzeMRN: [id number] of Birth: 1April 10, 1947

## 2019-12-07 ENCOUNTER — Ambulatory Visit: Payer: Medicare Other | Attending: Infectious Diseases

## 2019-12-07 ENCOUNTER — Other Ambulatory Visit: Payer: Self-pay

## 2019-12-07 DIAGNOSIS — R2681 Unsteadiness on feet: Secondary | ICD-10-CM | POA: Diagnosis not present

## 2019-12-07 DIAGNOSIS — Z9181 History of falling: Secondary | ICD-10-CM

## 2019-12-07 DIAGNOSIS — R262 Difficulty in walking, not elsewhere classified: Secondary | ICD-10-CM | POA: Diagnosis present

## 2019-12-07 DIAGNOSIS — M6281 Muscle weakness (generalized): Secondary | ICD-10-CM | POA: Insufficient documentation

## 2019-12-07 NOTE — Therapy (Signed)
[facility] [address], [place], [address] Phone: [phone]   Fax:  [phone]  Physical Therapy Treatment  Patient Details  Name: Tara Moody MRN: [id number] Date of Birth: 12/10/1945 Referring Provider (PT): Angelena Form, MD   Encounter Date: 12/07/2019  PT End of Session - 12/07/19 1036    Visit Number  9    Number of Visits  17    Date for PT Re-Evaluation  12/31/19    Authorization Type  9    Authorization Time Period  of 10 progress report    PT Start Time  1036    PT Stop Time  1117    PT Time Calculation (min)  41 min    Activity Tolerance  Patient tolerated treatment well    Behavior During Therapy  [facility] for tasks assessed/performed       Past Medical History:  Diagnosis Date  . Hypertension     Past Surgical History:  Procedure Laterality Date  . CATARACT EXTRACTION    . REPLACEMENT TOTAL KNEE BILATERAL      There were no vitals filed for this visit.  Subjective Assessment - 12/07/19 1038    Subjective  Balance is doing good. Feeling pretty good. Back pain has moved up to the bra line area but its still better. Has been doing a lot of work such as raking, picking up sticks, washing dishes, cleaning her car, vacuum. No back pain currently, maybe a 1/10. Feels more limber in her legs and her back. Feels like her back is more flexible. R heel is better. Wore tennis shoes that had good supports.    Pertinent History  Gait abnormality. Pt has had B knee, B shoulder replacements, B carpal tunnel surgeries. Pt hurt her L knee 2001. 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 Husband had a TBI and pt had take care of him until his death. Has had bouts of her L knee hurting. In the last 3 years, pt has been falling more. Fell 5 times in August 2020. Was outside walking the dogs and has had a hard time picking up her feet (has had that). Feels more tired since having COVID.  Has not fallen in the  past 2 months because she has been paying attention. Fell up the steps which knocked the wind out of her in 09/2019.  Had COVID end of August/beginning September 2020. Wants to prevent falls. [address] one more time in March 2021 when her dogs (jack russel and beagle; pt was walking them at the same time). Pt was on a road with little traffic but it has hills. Pt did not raise her R foot high enough and tripped on a step when pt fell in February 2021. Wonders if her R foot if shorter. When pt fell in August 2020, pt states that she tripped every time. Pt now able to catch herself when she trips. Kneels on her L knee to stand up from a fall. Pt reports tripping on R foot more than the L.  Pt has a house in [place]. Pt currently living in [place]. Planning on returning to [place] in September 2021. [place]: pt lives alone in a 1 story home. 3 steps front door with B rail, 4 steps back door, B rails. [place] home: 1 story home, 1 small step to enter, no rails (not a problem per pt). Same for back door.    Patient Stated Goals  Be better  able to perform floor to stand transfers.    Currently in Pain?  Yes    Pain Score  1     Pain Location  Thoracic   at bra line   Pain Onset  More than a month ago         [facility] PT Assessment - 12/07/19 1042      Dynamic Gait Index   Level Surface  Mild Impairment    Change in Gait Speed  Normal    Gait with Horizontal Head Turns  Mild Impairment   wobbles with R cervical rotation, no dizziness   Gait with Vertical Head Turns  Normal    Gait and Pivot Turn  Normal    Step Over Obstacle  Moderate Impairment    Step Around Obstacles  Normal    Steps  Mild Impairment    Total Score  19    DGI comment:  < 19/24 suggests increased risk for falls                           PT Education - 12/07/19 1232    Education Details  ther-ex    Person(s) Educated  Patient    Methods  Explanation;Demonstration;Tactile cues;Verbal cues    Comprehension   Returned demonstration;Verbalized understanding       Objectives  No latex band allergies Blood pressure is controlled per pt.   No B hip surgeries  Pt states she is usually walking when she loses balance.  Gait: increased lumbar rotation   Pt states R LE pain (L5/S1 dermatome from posterior hip to distal leg; comes and goes since 5-6 months ago/acute on chronic). Pt also states having B plantar fasciitis.   Pt also states feeling L lateral thigh pain and crossing her L leg over R (piriformis stretch) helps.    Pt states having L knee pain. Pt states having neck pain.   No back pain in standing   MedbridgeAccess Code [email or web address]  Gait: decreased stance L LE     Manual therapy  Seated STM B cervical paraspinal muscles to decrease muscle tension   Seated STM L levator scapula muscle    Therapeutic exercise  Directed patient with gait with normal gait speed, with changes in speed, 180 degree pivot turn, with R and L cervical rotation position, with cervical flexion and extension position, stepping around obstacles, stepping over an obstacle, ascending and descending 4 regular steps with UE assist    Reviewed progress with balance with pt.    Sitting cervical posture: R lateral shift Seated L cervical side bend isometrics with PT 10x5 seconds for 3 sts  Chin tucks 10x2 with 5 second holds   Gait with head turns to assess effectiveness of treatment.   Improved exercise technique, movement at target joints, use of target muscles after mod verbal, visual, tactile cues.     Clinical impression/pt response: Pt demonstrates overall improved balance based on DGI as well as reports of improved ability to perform functional tasks. Pt still demonstrates unsteadiness with gait with head turns as well as difficulty with stair negotiation without UE assist. Pt tolerated session well without aggravation of symptoms. Pt will benefit from  continued skilled physical therapy services to decrease back pain, improve strength, balance, and function.       PT Short Term Goals - 11/02/19 1900      PT SHORT TERM GOAL #1   Title  Pt will be independent with her  HEP to improve balance and decrease fall risk.    Time  3    Period  Weeks    Status  New    Target Date  12/17/19        PT Long Term Goals - 12/01/19 1627      PT LONG TERM GOAL #1   Title  Patient will improve bilateral hip extension and abduction strength to improve steadiness with gait, and standing tasks, and decrease fall risk.    Time  8    Period  Weeks    Status  Achieved    Target Date  12/31/19      PT LONG TERM GOAL #2   Title  Patient will improve her DGI balance score to 19/24 or more as a demonstration of improved balance.    Baseline  13/24 DGI (11/02/2019)    Time  8    Period  Weeks    Status  On-going    Target Date  12/31/19      PT LONG TERM GOAL #3   Title  Patient will have a decrease in low back pain to 4/10 or less at worst to promote ability to peform standing tasks such as washing dishes or sweeping the floor more comfortably    Baseline  7/10 low back pain at most for the past 3 months (11/02/2019); 2/10 low back pain at most for the past 7 days (12/01/2019)    Time  8    Period  Weeks    Status  Achieved    Target Date  12/31/19      PT LONG TERM GOAL #4   Title  Pt will be able to step over a shoe box and clear both feet independently at least 10x each LE to improve balance and decrease fall risk.    Baseline  Unable to clear shoe box when stepping over it (11/02/2019).    Time  8    Period  Weeks    Status  On-going    Target Date  12/31/19      PT LONG TERM GOAL #5   Title  Pt will report decreased to no difficulty with stair negotiation with one UE assist (4 regular steps) to promote ability to get into and out of her house more easily.    Baseline  Difficulty with stair negotiation secondary to fear of falling  (11/02/2019); difficulty secondary to fear of falling. No difficulty wiht B UE assist. Difficulty when carrying something without UE assist (12/01/2019)    Time  8    Period  Weeks    Status  Partially Met    Target Date  12/31/19            Plan - 12/07/19 1236    Clinical Impression Statement  Pt demonstrates overall improved balance based on DGI as well as reports of improved ability to perform functional tasks. Pt still demonstrates unsteadiness with gait with head turns as well as difficulty with stair negotiation without UE assist. Pt tolerated session well without aggravation of symptoms. Pt will benefit from continued skilled physical therapy services to decrease back pain, improve strength, balance, and function.    Personal Factors and Comorbidities  Age;Comorbidity 3+;Fitness;Past/Current Experience;Time since onset of injury/illness/exacerbation    Comorbidities  B total shoulder, B total knee replacements, low back pain with radiating symptoms, L knee pain, neck pain    Examination-Activity Limitations  Lift;Stairs;Carry    Stability/Clinical Decision Making  Evolving/Moderate complexity  Rehab Potential  Fair    PT Frequency  2x / week    PT Duration  8 weeks    PT Treatment/Interventions  Therapeutic activities;Gait training;Stair training;Functional mobility training;Therapeutic exercise;Balance training;Neuromuscular re-education;Patient/family education;Manual techniques;Dry needling;Aquatic Therapy;Canalith Repostioning;Electrical Stimulation;Iontophoresis 34m/ml Dexamethasone;Vestibular    PT Next Visit Plan  posture, scapular strengthening, thoracic extension, trunk, glute strengthening, femoral control, balance, manual techniques, modalities PRN    Consulted and Agree with Plan of Care  Patient       Patient will benefit from skilled therapeutic intervention in order to improve the following deficits and impairments:  Pain, Postural dysfunction, Improper body  mechanics, Difficulty walking, Decreased strength, Decreased balance, Abnormal gait  Visit Diagnosis: Unsteadiness on feet  Difficulty in walking, not elsewhere classified  Muscle weakness (generalized)  History of falling     Problem List Patient Active Problem List   Diagnosis Date Noted  . Influenza A 08/15/2016     MJoneen BoersPT, DPT   12/07/2019, 12:38 PM   [facility] [address] [place] [address]Phone: [phone]  Fax:  [phone] Name: Tara MaslankaMRN: [id number] of Birth: 1Aug 11, 1947

## 2019-12-10 ENCOUNTER — Ambulatory Visit: Payer: Medicare Other

## 2019-12-14 ENCOUNTER — Other Ambulatory Visit: Payer: Self-pay

## 2019-12-14 ENCOUNTER — Ambulatory Visit: Payer: Medicare Other

## 2019-12-14 DIAGNOSIS — R2681 Unsteadiness on feet: Secondary | ICD-10-CM | POA: Diagnosis not present

## 2019-12-14 DIAGNOSIS — M6281 Muscle weakness (generalized): Secondary | ICD-10-CM

## 2019-12-14 DIAGNOSIS — Z9181 History of falling: Secondary | ICD-10-CM

## 2019-12-14 DIAGNOSIS — R262 Difficulty in walking, not elsewhere classified: Secondary | ICD-10-CM

## 2019-12-14 NOTE — Therapy (Signed)
[facility] [address] [place], [address] Phone: [phone]   Fax:  [phone]  Physical Therapy Treatment And Progress Report (11/02/2019 - 12/14/2019)  Patient Details  Name: Tara Moody MRN: [id number] Date of Birth: 03/10/1946 Referring Provider (PT): Angelena Form, MD   Encounter Date: 12/14/2019  PT End of Session - 12/14/19 1306    Visit Number  10    Number of Visits  17    Date for PT Re-Evaluation  12/31/19    Authorization Type  10    Authorization Time Period  of 10 progress report    PT Start Time  1306    PT Stop Time  1346    PT Time Calculation (min)  40 min    Activity Tolerance  Patient tolerated treatment well    Behavior During Therapy  [facility] for tasks assessed/performed       Past Medical History:  Diagnosis Date  . Hypertension     Past Surgical History:  Procedure Laterality Date  . CATARACT EXTRACTION    . REPLACEMENT TOTAL KNEE BILATERAL      There were no vitals filed for this visit.  Subjective Assessment - 12/14/19 1307    Subjective  Doing pretty good. Back is better. Tries to pick her feet up when walking.    Pertinent History  Gait abnormality. Pt has had B knee, B shoulder replacements, B carpal tunnel surgeries. Pt hurt her L knee 2001. 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 Husband had a TBI and pt had take care of him until his death. Has had bouts of her L knee hurting. In the last 3 years, pt has been falling more. Fell 5 times in August 2020. Was outside walking the dogs and has had a hard time picking up her feet (has had that). Feels more tired since having COVID.  Has not fallen in the past 2 months because she has been paying attention. Fell up the steps which knocked the wind out of her in 09/2019.  Had COVID end of August/beginning September 2020. Wants to prevent falls. [place] [address] one more time in March 2021 when her dogs (jack russel and beagle; pt was  walking them at the same time). Pt was on a road with little traffic but it has hills. Pt did not raise her R foot high enough and tripped on a step when pt fell in February 2021. Wonders if her R foot if shorter. When pt fell in August 2020, pt states that she tripped every time. Pt now able to catch herself when she trips. Kneels on her L knee to stand up from a fall. Pt reports tripping on R foot more than the L.  Pt has a house in [place]. Pt currently living in [place]. Planning on returning to [place] in September 2021. [place]: pt lives alone in a 1 story home. 3 steps front door with B rail, 4 steps back door, B rails. [place] home: 1 story home, 1 small step to enter, no rails (not a problem per pt). Same for back door.    Patient Stated Goals  Be better able to perform floor to stand transfers.    Currently in Pain?  No/denies    Pain Score  0-No pain    Pain Onset  More than a month ago  PT Education - 12/14/19 1320    Education Details  ther-ex    Person(s) Educated  Patient    Methods  Explanation;Demonstration;Tactile cues;Verbal cues    Comprehension  Returned demonstration;Verbalized understanding          Objectives  No latex band allergies Blood pressure is controlled per pt.   No B hip surgeries  Pt states she is usually walking when she loses balance.  Gait: increased lumbar rotation   Pt states R LE pain (L5/S1 dermatome from posterior hip to distal leg; comes and goes since 5-6 months ago/acute on chronic). Pt also states having B plantar fasciitis.   Pt also states feeling L lateral thigh pain and crossing her L leg over R (piriformis stretch) helps.    Pt states having L knee pain. Pt states having neck pain.   No back pain in standing     [facility] PT Assessment - 12/07/19 1042            Dynamic Gait Index   Level Surface  Mild Impairment    Change in Gait Speed  Normal     Gait with Horizontal Head Turns  Mild Impairment   wobbles with R cervical rotation, no dizziness   Gait with Vertical Head Turns  Normal    Gait and Pivot Turn  Normal    Step Over Obstacle  Moderate Impairment    Step Around Obstacles  Normal    Steps  Mild Impairment    Total Score  19    DGI comment:  < 19/24 suggests increased risk for falls        MedbridgeAccess Code [email or web address]  Gait: decreased stance L LE        Therapeutic exercise   sit <> stand from low mat table to promote quadriceps strength 10x2  S/L hip abduction   R 10x3. Central low back discomfort  L 10x3. PT assist to decrease L hip hike compensation  Supine posterior pelvic tilt 10x3 with 5 second holds   Sitting with upright posture and transversus abdominis muscle contraction   Manual perturbation with PT all planes 1 minute x 3 to promote trunk stability  Sitting B scapular retraction 10x5 seconds to promote thoracic extension and decrease stress to low back for 3 sets  SLS with contralateral UE light touch assist to promote glute med strength and single leg stance balance when stepping over obstacles   R 10x5 seconds for 2 sets  L 10x5 seconds for 2 sets  Stepping over shoe box 3x each LE, CGA to Min A  Cues for increased hip flexion, foot clearance, step length and decrease circumduction compensation   Improved exercise technique, movement at target joints, use of target muscles after mod verbal, visual, tactile cues.     Clinical impression/pt response: Pt demonstrates overall improved balance based on DGI, improved hip strength significant decrease in low back pain since initial evaluation. Pt still demonstrates difficulty stepping over an obstacle in which glute med weakness may play a factor in single leg stance balance and strength. Pt also still demonstrates difficulty with stair negotiation without UE assist, such as when carrying items and would  benefit from continued skilled physical therapy services to address the aforementioned deficits.          PT Short Term Goals - 12/14/19 1458      PT SHORT TERM GOAL #1   Title  Pt will be independent with her HEP to improve balance and decrease fall risk.  Baseline  Pt performing her HEP (12/14/2019)    Time  3    Period  Weeks    Status  Achieved    Target Date  12/17/19        PT Long Term Goals - 12/14/19 1322      PT LONG TERM GOAL #1   Title  Patient will improve bilateral hip extension and abduction strength to improve steadiness with gait, and standing tasks, and decrease fall risk.    Time  8    Period  Weeks    Status  Achieved    Target Date  12/31/19      PT LONG TERM GOAL #2   Title  Patient will improve her DGI balance score to 19/24 or more as a demonstration of improved balance.    Baseline  13/24 DGI (11/02/2019); 19/24 (12/07/2019)    Time  8    Period  Weeks    Status  Achieved    Target Date  12/31/19      PT LONG TERM GOAL #3   Title  Patient will have a decrease in low back pain to 4/10 or less at worst to promote ability to peform standing tasks such as washing dishes or sweeping the floor more comfortably    Baseline  7/10 low back pain at most for the past 3 months (11/02/2019); 2/10 low back pain at most for the past 7 days (12/01/2019), (12/14/2019)    Time  8    Period  Weeks    Status  Achieved    Target Date  12/31/19      PT LONG TERM GOAL #4   Title  Pt will be able to step over a shoe box and clear both feet independently at least 10x each LE to improve balance and decrease fall risk.    Baseline  Unable to clear shoe box when stepping over it (11/02/2019).    Time  8    Period  Weeks    Status  On-going    Target Date  12/31/19      PT LONG TERM GOAL #5   Title  Pt will report decreased to no difficulty with stair negotiation with one UE assist (4 regular steps) to promote ability to get into and out of her house more easily.     Baseline  Difficulty with stair negotiation secondary to fear of falling (11/02/2019); difficulty secondary to fear of falling. No difficulty wiht B UE assist. Difficulty when carrying something without UE assist (12/01/2019)    Time  8    Period  Weeks    Status  Partially Met    Target Date  12/31/19            Plan - 12/14/19 1320    Clinical Impression Statement  Pt demonstrates overall improved balance based on DGI, improved hip strength significant decrease in low back pain since initial evaluation. Pt still demonstrates difficulty stepping over an obstacle in which glute med weakness may play a factor in single leg stance balance and strength. Pt also still demonstrates difficulty with stair negotiation without UE assist, such as when carrying items and would benefit from continued skilled physical therapy services to address the aforementioned deficits.    Personal Factors and Comorbidities  Age;Comorbidity 3+;Fitness;Past/Current Experience;Time since onset of injury/illness/exacerbation    Comorbidities  B total shoulder, B total knee replacements, low back pain with radiating symptoms, L knee pain, neck pain    Examination-Activity Limitations  Lift;Stairs;Carry    Stability/Clinical Decision Making  Stable/Uncomplicated    Clinical Decision Making  Low    Rehab Potential  Fair    PT Frequency  2x / week    PT Duration  8 weeks    PT Treatment/Interventions  Therapeutic activities;Gait training;Stair training;Functional mobility training;Therapeutic exercise;Balance training;Neuromuscular re-education;Patient/family education;Manual techniques;Dry needling;Aquatic Therapy;Canalith Repostioning;Electrical Stimulation;Iontophoresis 103m/ml Dexamethasone;Vestibular    PT Next Visit Plan  posture, scapular strengthening, thoracic extension, trunk, glute strengthening, femoral control, balance, manual techniques, modalities PRN    Consulted and Agree with Plan of Care  Patient        Patient will benefit from skilled therapeutic intervention in order to improve the following deficits and impairments:  Pain, Postural dysfunction, Improper body mechanics, Difficulty walking, Decreased strength, Decreased balance, Abnormal gait  Visit Diagnosis: Unsteadiness on feet  Difficulty in walking, not elsewhere classified  Muscle weakness (generalized)  History of falling     Problem List Patient Active Problem List   Diagnosis Date Noted  . Influenza A 08/15/2016    Thank you for your referral.  MJoneen BoersPT, DPT    12/14/2019, 3:10 PM  [facility] [address] [place] [address]Phone: [phone]  Fax:  [phone] Name: Tara SanzoMRN: [id number] of Birth: [birth year]

## 2019-12-17 ENCOUNTER — Ambulatory Visit: Payer: Medicare Other

## 2019-12-17 ENCOUNTER — Other Ambulatory Visit: Payer: Self-pay

## 2019-12-17 DIAGNOSIS — R2681 Unsteadiness on feet: Secondary | ICD-10-CM

## 2019-12-17 DIAGNOSIS — R262 Difficulty in walking, not elsewhere classified: Secondary | ICD-10-CM

## 2019-12-17 DIAGNOSIS — Z9181 History of falling: Secondary | ICD-10-CM

## 2019-12-17 DIAGNOSIS — M6281 Muscle weakness (generalized): Secondary | ICD-10-CM

## 2019-12-17 NOTE — Patient Instructions (Signed)
Access Code: [email or web address] URL: [email or web address] Date: 11/25/2019 Prepared by: Loralyn Freshwater  Exercises Hooklying Clamshell with Resistance - 1 x daily - 7 x weekly - 3 sets - 10 reps Supine Posterior Pelvic Tilt - 1 x daily - 7 x weekly - 3 sets - 10 reps - 5 seconds hold Seated Cervical Retraction - 1 x daily - 7 x weekly - 3 sets - 10 reps - 5 hold Standing Hip Abduction with Counter Support - 1 x daily - 7 x weekly - 3 sets - 10 reps - 5 seconds hold

## 2019-12-17 NOTE — Therapy (Signed)
[facility] [address] [place], [address] Phone: [phone]   Fax:  [phone]  Physical Therapy Treatment  Patient Details  Name: Tara Moody MRN: [id number] Date of Birth: 01-Mar-1946 Referring Provider (PT): Tara Form, MD    Encounter Date: 12/17/2019  PT End of Session - 12/17/19 1421    Visit Number  11    Number of Visits  17    Date for PT Re-Evaluation  12/31/19    Authorization Type  11    Authorization Time Period  of 10 progress report    PT Start Time  1421    PT Stop Time  1504    PT Time Calculation (min)  43 min    Activity Tolerance  Patient tolerated treatment well    Behavior During Therapy  [facility] for tasks assessed/performed       Past Medical History:  Diagnosis Date  . Hypertension     Past Surgical History:  Procedure Laterality Date  . CATARACT EXTRACTION    . REPLACEMENT TOTAL KNEE BILATERAL      There were no vitals filed for this visit.  Subjective Assessment - 12/17/19 1422    Subjective  Doing pretty good, no pain. Has been working on going up the steps several times, trying to use her leg muscles more with a reciprocal pattern.    Pertinent History  Gait abnormality. Pt has had B knee, B shoulder replacements, B carpal tunnel surgeries. Pt hurt her L knee 2001. 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 Husband had a TBI and pt had take care of him until his death. Has had bouts of her L knee hurting. In the last 3 years, pt has been falling more. Fell 5 times in August 2020. Was outside walking the dogs and has had a hard time picking up her feet (has had that). Feels more tired since having COVID.  Has not fallen in the past 2 months because she has been paying attention. Fell up the steps which knocked the wind out of her in 09/2019.  Had COVID end of August/beginning September 2020. Wants to prevent falls. [address] one more time in March 2021 when her dogs (jack  russel and beagle; pt was walking them at the same time). Pt was on a road with little traffic but it has hills. Pt did not raise her R foot high enough and tripped on a step when pt fell in February 2021. Wonders if her R foot if shorter. When pt fell in August 2020, pt states that she tripped every time. Pt now able to catch herself when she trips. Kneels on her L knee to stand up from a fall. Pt reports tripping on R foot more than the L.  Pt has a house in [place]. Pt currently living in [place]. Planning on returning to [place] in September 2021. [name]: pt lives alone in a 1 story home. 3 steps front door with B rail, 4 steps back door, B rails. [place] home: 1 story home, 1 small step to enter, no rails (not a problem per pt). Same for back door.    Patient Stated Goals  Be better able to perform floor to stand transfers.    Currently in Pain?  No/denies    Pain Score  0-No pain    Pain Onset  More than a month ago  PT Education - 12/17/19 1807    Education Details  ther-ex. HEP    Person(s) Educated  Patient    Methods  Explanation;Demonstration;Tactile cues;Verbal cues;Handout    Comprehension  Returned demonstration;Verbalized understanding        MedbridgeAccess Code [email or web address]  Gait: decreased stance L LE        Therapeutic exercise  S/L hip abduction              R 10x3.              L 10x3.   Sitting with upright posture and transversus abdominis muscle activation  Manual perturbation from PT all directions 1 minute x 2, then 45 seconds  SLS with light touch assist   R 10x5 seconds for 2 sets  L 10x5 seconds for 2 sets  Stepping over 4 mini hurdles, emphasis on hip flexion and foot clearance  8x.   Standing hip abduction with B UE assist   R 10x5 seconds for 2 sets  L 10x5 seconds for 2 sets    sit <> stand from chair with arms to promote quadriceps strength 10x, eccentric lowering,  no UE assist    Improved exercise technique, movement at target joints, use of target muscles after mod verbal, visual, tactile cues.     Clinical impression/pt response: Continued working on improving glute med and trunk strength to promote single leg balance to promote ability to perform contralateral hip flexion and step length when stepping over obstacles. Difficulty stepping over mini hurdles today. Pt tolerated session well without aggravation of symptoms. Pt will benefit from continued skilled physical therapy services to improve strength, balance, and function.       PT Short Term Goals - 12/14/19 1458      PT SHORT TERM GOAL #1   Title  Pt will be independent with her HEP to improve balance and decrease fall risk.    Baseline  Pt performing her HEP (12/14/2019)    Time  3    Period  Weeks    Status  Achieved    Target Date  12/17/19        PT Long Term Goals - 12/14/19 1322      PT LONG TERM GOAL #1   Title  Patient will improve bilateral hip extension and abduction strength to improve steadiness with gait, and standing tasks, and decrease fall risk.    Time  8    Period  Weeks    Status  Achieved    Target Date  12/31/19      PT LONG TERM GOAL #2   Title  Patient will improve her DGI balance score to 19/24 or more as a demonstration of improved balance.    Baseline  13/24 DGI (11/02/2019); 19/24 (12/07/2019)    Time  8    Period  Weeks    Status  Achieved    Target Date  12/31/19      PT LONG TERM GOAL #3   Title  Patient will have a decrease in low back pain to 4/10 or less at worst to promote ability to peform standing tasks such as washing dishes or sweeping the floor more comfortably    Baseline  7/10 low back pain at most for the past 3 months (11/02/2019); 2/10 low back pain at most for the past 7 days (12/01/2019), (12/14/2019)    Time  8    Period  Weeks    Status  Achieved    Target  Date  12/31/19      PT LONG TERM GOAL #4   Title  Pt will be  able to step over a shoe box and clear both feet independently at least 10x each LE to improve balance and decrease fall risk.    Baseline  Unable to clear shoe box when stepping over it (11/02/2019).    Time  8    Period  Weeks    Status  On-going    Target Date  12/31/19      PT LONG TERM GOAL #5   Title  Pt will report decreased to no difficulty with stair negotiation with one UE assist (4 regular steps) to promote ability to get into and out of her house more easily.    Baseline  Difficulty with stair negotiation secondary to fear of falling (11/02/2019); difficulty secondary to fear of falling. No difficulty wiht B UE assist. Difficulty when carrying something without UE assist (12/01/2019)    Time  8    Period  Weeks    Status  Partially Met    Target Date  12/31/19            Plan - 12/17/19 1811    Clinical Impression Statement  Continued working on improving glute med and trunk strength to promote single leg balance to promote ability to perform contralateral hip flexion and step length when stepping over obstacles. Difficulty stepping over mini hurdles today. Pt tolerated session well without aggravation of symptoms. Pt will benefit from continued skilled physical therapy services to improve strength, balance, and function.    Personal Factors and Comorbidities  Age;Comorbidity 3+;Fitness;Past/Current Experience;Time since onset of injury/illness/exacerbation    Comorbidities  B total shoulder, B total knee replacements, low back pain with radiating symptoms, L knee pain, neck pain    Examination-Activity Limitations  Lift;Stairs;Carry    Stability/Clinical Decision Making  Stable/Uncomplicated    Rehab Potential  Fair    PT Frequency  2x / week    PT Duration  8 weeks    PT Treatment/Interventions  Therapeutic activities;Gait training;Stair training;Functional mobility training;Therapeutic exercise;Balance training;Neuromuscular re-education;Patient/family education;Manual  techniques;Dry needling;Aquatic Therapy;Canalith Repostioning;Electrical Stimulation;Iontophoresis 82m/ml Dexamethasone;Vestibular    PT Next Visit Plan  posture, scapular strengthening, thoracic extension, trunk, glute strengthening, femoral control, balance, manual techniques, modalities PRN    Consulted and Agree with Plan of Care  Patient       Patient will benefit from skilled therapeutic intervention in order to improve the following deficits and impairments:  Pain, Postural dysfunction, Improper body mechanics, Difficulty walking, Decreased strength, Decreased balance, Abnormal gait  Visit Diagnosis: Unsteadiness on feet  Difficulty in walking, not elsewhere classified  Muscle weakness (generalized)  History of falling     Problem List Patient Active Problem List   Diagnosis Date Noted  . Influenza A 08/15/2016    MJoneen BoersPT, DPT   12/17/2019, 6:13 PM  [facility] [address] [place] [address]Phone: [phone]  Fax:  [phone] Name: BFraidy MccarrickMRN: [id number] of Birth: [birth year]

## 2019-12-21 ENCOUNTER — Ambulatory Visit: Payer: Medicare Other

## 2019-12-21 ENCOUNTER — Other Ambulatory Visit: Payer: Self-pay

## 2019-12-21 DIAGNOSIS — R2681 Unsteadiness on feet: Secondary | ICD-10-CM

## 2019-12-21 DIAGNOSIS — M6281 Muscle weakness (generalized): Secondary | ICD-10-CM

## 2019-12-21 DIAGNOSIS — R262 Difficulty in walking, not elsewhere classified: Secondary | ICD-10-CM

## 2019-12-21 DIAGNOSIS — Z9181 History of falling: Secondary | ICD-10-CM

## 2019-12-21 NOTE — Therapy (Signed)
[facility] [address] [place], [address] Phone: [phone]   Fax:  [phone]  Physical Therapy Treatment  Patient Details  Name: Tara Moody MRN: [id number] Date of Birth: March 06, 1946 Referring Provider (PT): Angelena Form, MD   Encounter Date: 12/21/2019  PT End of Session - 12/21/19 1303    Visit Number  12    Number of Visits  17    Date for PT Re-Evaluation  12/31/19    Authorization Type  2    Authorization Time Period  of 10 progress report    PT Start Time  1303    PT Stop Time  1346    PT Time Calculation (min)  43 min    Activity Tolerance  Patient tolerated treatment well    Behavior During Therapy  [facility] for tasks assessed/performed       Past Medical History:  Diagnosis Date  . Hypertension     Past Surgical History:  Procedure Laterality Date  . CATARACT EXTRACTION    . REPLACEMENT TOTAL KNEE BILATERAL      There were no vitals filed for this visit.  Subjective Assessment - 12/21/19 1304    Subjective  Doing pretty good. Still working on the alternating steps on the stairs. No pain currently.    Pertinent History  Gait abnormality. Pt has had B knee, B shoulder replacements, B carpal tunnel surgeries. Pt hurt her L knee 2001. 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 Husband had a TBI and pt had take care of him until his death. Has had bouts of her L knee hurting. In the last 3 years, pt has been falling more. Fell 5 times in August 2020. Was outside walking the dogs and has had a hard time picking up her feet (has had that). Feels more tired since having COVID.  Has not fallen in the past 2 months because she has been paying attention. Fell up the steps which knocked the wind out of her in 09/2019.  Had COVID end of August/beginning September 2020. Wants to prevent falls. [address] one more time in March 2021 when her dogs (jack russel and beagle; pt was walking them at the same time).  Pt was on a road with little traffic but it has hills. Pt did not raise her R foot high enough and tripped on a step when pt fell in February 2021. Wonders if her R foot if shorter. When pt fell in August 2020, pt states that she tripped every time. Pt now able to catch herself when she trips. Kneels on her L knee to stand up from a fall. Pt reports tripping on R foot more than the L.  Pt has a house in [place]. Pt currently living in [place]. Planning on returning to [place] in September 2021. [place]: pt lives alone in a 1 story home. 3 steps front door with B rail, 4 steps back door, B rails. [place] home: 1 story home, 1 small step to enter, no rails (not a problem per pt). Same for back door.    Patient Stated Goals  Be better able to perform floor to stand transfers.    Currently in Pain?  No/denies    Pain Score  0-No pain    Pain Onset  More than a month ago  PT Education - 12/21/19 1307    Education Details  ther-ex    Person(s) Educated  Patient    Methods  Explanation;Demonstration;Tactile cues;Verbal cues    Comprehension  Returned demonstration;Verbalized understanding        MedbridgeAccess Code [email or web address]  Gait: decreased stance L LE    Therapeutic exercise  S/L hip abduction  R 10x3.  L 10x3.   SLS with light touch assist              R 10x5 seconds for 2 sets             L 10x5 seconds for 2 sets. R posterior thigh pull which decreased with R hip hike  Standing R trunk side bend 10x5 seconds  Side stepping 32 ft to the R and 32 ft to the L   Then with yellow band around distal thighs 32 ft to the R and 32 ft to the L   Pt tendency to tip toe  Standing B gastroc stretch at 1st stair step  30 seconds x 3  Forward step up onto 1st regular step with light touch assist. Cues for femoral control.   R 10x2  L 10x2  Stepping over 4 mini hurdles, emphasis on hip flexion and foot  clearance             8x.   LOB x 1 to the L when on L LE. Improved ability to clear obstacle when pt looks down.       Improved exercise technique, movement at target joints, use of target muscles after mod verbal, visual, tactile cues.     Clinical impression/pt response: Improved ability to clear obstacles such as mini hurdles when pt actually looks at the obstacle. Cues for increasing step length to clear obstacles. Continued working on improving glute med strength to improve steadiness during single leg stand while stepping over with the contralateral LE. Pt tolerated session well without aggravation of symptoms. Pt will benefit from continued skilled physical therapy services to improve strength, balance and function.       PT Short Term Goals - 12/14/19 1458      PT SHORT TERM GOAL #1   Title  Pt will be independent with her HEP to improve balance and decrease fall risk.    Baseline  Pt performing her HEP (12/14/2019)    Time  3    Period  Weeks    Status  Achieved    Target Date  12/17/19        PT Long Term Goals - 12/14/19 1322      PT LONG TERM GOAL #1   Title  Patient will improve bilateral hip extension and abduction strength to improve steadiness with gait, and standing tasks, and decrease fall risk.    Time  8    Period  Weeks    Status  Achieved    Target Date  12/31/19      PT LONG TERM GOAL #2   Title  Patient will improve her DGI balance score to 19/24 or more as a demonstration of improved balance.    Baseline  13/24 DGI (11/02/2019); 19/24 (12/07/2019)    Time  8    Period  Weeks    Status  Achieved    Target Date  12/31/19      PT LONG TERM GOAL #3   Title  Patient will have a decrease in low back pain to 4/10 or less at worst to promote ability  to peform standing tasks such as washing dishes or sweeping the floor more comfortably    Baseline  7/10 low back pain at most for the past 3 months (11/02/2019); 2/10 low back pain at most for the past  7 days (12/01/2019), (12/14/2019)    Time  8    Period  Weeks    Status  Achieved    Target Date  12/31/19      PT LONG TERM GOAL #4   Title  Pt will be able to step over a shoe box and clear both feet independently at least 10x each LE to improve balance and decrease fall risk.    Baseline  Unable to clear shoe box when stepping over it (11/02/2019).    Time  8    Period  Weeks    Status  On-going    Target Date  12/31/19      PT LONG TERM GOAL #5   Title  Pt will report decreased to no difficulty with stair negotiation with one UE assist (4 regular steps) to promote ability to get into and out of her house more easily.    Baseline  Difficulty with stair negotiation secondary to fear of falling (11/02/2019); difficulty secondary to fear of falling. No difficulty wiht B UE assist. Difficulty when carrying something without UE assist (12/01/2019)    Time  8    Period  Weeks    Status  Partially Met    Target Date  12/31/19            Plan - 12/21/19 1308    Clinical Impression Statement  Improved ability to clear obstacles such as mini hurdles when pt actually looks at the obstacle. Cues for increasing step length to clear obstacles. Continued working on improving glute med strength to improve steadiness during single leg stand while stepping over with the contralateral LE. Pt tolerated session well without aggravation of symptoms. Pt will benefit from continued skilled physical therapy services to improve strength, balance and function.    Personal Factors and Comorbidities  Age;Comorbidity 3+;Fitness;Past/Current Experience;Time since onset of injury/illness/exacerbation    Comorbidities  B total shoulder, B total knee replacements, low back pain with radiating symptoms, L knee pain, neck pain    Examination-Activity Limitations  Lift;Stairs;Carry    Stability/Clinical Decision Making  Stable/Uncomplicated    Rehab Potential  Fair    PT Frequency  2x / week    PT Duration  8 weeks     PT Treatment/Interventions  Therapeutic activities;Gait training;Stair training;Functional mobility training;Therapeutic exercise;Balance training;Neuromuscular re-education;Patient/family education;Manual techniques;Dry needling;Aquatic Therapy;Canalith Repostioning;Electrical Stimulation;Iontophoresis 90m/ml Dexamethasone;Vestibular    PT Next Visit Plan  posture, scapular strengthening, thoracic extension, trunk, glute strengthening, femoral control, balance, manual techniques, modalities PRN    Consulted and Agree with Plan of Care  Patient       Patient will benefit from skilled therapeutic intervention in order to improve the following deficits and impairments:  Pain, Postural dysfunction, Improper body mechanics, Difficulty walking, Decreased strength, Decreased balance, Abnormal gait  Visit Diagnosis: Unsteadiness on feet  Difficulty in walking, not elsewhere classified  Muscle weakness (generalized)  History of falling     Problem List Patient Active Problem List   Diagnosis Date Noted  . Influenza A 08/15/2016    MJoneen BoersPT, DPT   12/21/2019, 4:18 PM   [facility] [address] [place] [address]Phone: [phone]  Fax:  [phone] Name: Tara Moody MRN: [id number] Date of Birth: [birth year]

## 2019-12-24 ENCOUNTER — Other Ambulatory Visit: Payer: Self-pay

## 2019-12-24 ENCOUNTER — Ambulatory Visit: Payer: Medicare Other

## 2019-12-24 DIAGNOSIS — M6281 Muscle weakness (generalized): Secondary | ICD-10-CM

## 2019-12-24 DIAGNOSIS — Z9181 History of falling: Secondary | ICD-10-CM

## 2019-12-24 DIAGNOSIS — R2681 Unsteadiness on feet: Secondary | ICD-10-CM

## 2019-12-24 DIAGNOSIS — R262 Difficulty in walking, not elsewhere classified: Secondary | ICD-10-CM

## 2019-12-24 NOTE — Therapy (Signed)
[facility] [address] [place], [address] Phone: [phone]   Fax:  [phone]  Physical Therapy Treatment  Patient Details  Name: Tara Moody MRN: [id number] Date of Birth: 1946-06-11 Referring Provider (PT): Angelena Form, MD   Encounter Date: 12/24/2019  PT End of Session - 12/24/19 1135    Visit Number  13    Number of Visits  17    Date for PT Re-Evaluation  12/31/19    Authorization Type  3    Authorization Time Period  of 10 progress report    PT Start Time  1136    PT Stop Time  1201    PT Time Calculation (min)  25 min    Activity Tolerance  Patient tolerated treatment well    Behavior During Therapy  [facility] for tasks assessed/performed       Past Medical History:  Diagnosis Date  . Hypertension     Past Surgical History:  Procedure Laterality Date  . CATARACT EXTRACTION    . REPLACEMENT TOTAL KNEE BILATERAL      There were no vitals filed for this visit.  Subjective Assessment - 12/24/19 1137    Subjective  Doing pretty good. Tries to lift her feet when walking her dog.  The pain in her L thigh is better, its almost gone.    Pertinent History  Gait abnormality. Pt has had B knee, B shoulder replacements, B carpal tunnel surgeries. Pt hurt her L knee 2001. 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 Husband had a TBI and pt had take care of him until his death. Has had bouts of her L knee hurting. In the last 3 years, pt has been falling more. Fell 5 times in August 2020. Was outside walking the dogs and has had a hard time picking up her feet (has had that). Feels more tired since having COVID.  Has not fallen in the past 2 months because she has been paying attention. Fell up the steps which knocked the wind out of her in 09/2019.  Had COVID end of August/beginning September 2020. Wants to prevent falls. [address] one more time in March 2021 when her dogs (jack russel and beagle; pt was walking  them at the same time). Pt was on a road with little traffic but it has hills. Pt did not raise her R foot high enough and tripped on a step when pt fell in February 2021. Wonders if her R foot if shorter. When pt fell in August 2020, pt states that she tripped every time. Pt now able to catch herself when she trips. Kneels on her L knee to stand up from a fall. Pt reports tripping on R foot more than the L.  Pt has a house in [place]. Pt currently living in [place]. Planning on returning to [place] in September 2021. [place]: pt lives alone in a 1 story home. 3 steps front door with B rail, 4 steps back door, B rails. [place] home: 1 story home, 1 small step to enter, no rails (not a problem per pt). Same for back door.    Patient Stated Goals  Be better able to perform floor to stand transfers.    Currently in Pain?  No/denies    Pain Onset  More than a month ago  PT Education - 12/24/19 1143    Education Details  ther-ex    Person(s) Educated  Patient    Methods  Explanation;Demonstration;Tactile cues;Verbal cues    Comprehension  Returned demonstration;Verbalized understanding        MedbridgeAccess Code [id number]  Gait: decreased stance L LE    Therapeutic exercise  S/L hip abduction  R 10x3.  L 10x3.   S/L clamshell  L 10x3  R 10x3  Supine posterior pelvic tilt 10x3 with 5 second holds   SLS with light touch assist   L 10x5 seconds for 3 sets.   Good glute med muscle use felt   Stepping over 4 mini hurdles, emphasis on hip flexion and foot clearance 8x.  LOB x 1 to the L when on L LE. Improved ability to clear obstacle with cues to transfer her center of gravity onto her foot in front as well as improving knee flexion of back foot for foot clearance   Improved exercise technique, movement at target joints, use of target muscles after mod verbal,  visual, tactile cues.     Clinical impression/pt response: Pt arrived late so session adjusted accordingly. Continued working on improving glute med and trunk muscle strength to promote single leg stance balance when stepping over the contralateral foot. Pt also improved ability to step over mini hurdles after cues to transfer her center of gravity onto her foot in front as well as improving knee flexion of back foot for foot clearance. Pt tolerated session well without aggravation of symptoms. Good muscle use felt with exercises. Pt will benefit from continued skilled physical therapy services to improve strength, balance, and function.      PT Short Term Goals - 12/14/19 1458      PT SHORT TERM GOAL #1   Title  Pt will be independent with her HEP to improve balance and decrease fall risk.    Baseline  Pt performing her HEP (12/14/2019)    Time  3    Period  Weeks    Status  Achieved    Target Date  12/17/19        PT Long Term Goals - 12/14/19 1322      PT LONG TERM GOAL #1   Title  Patient will improve bilateral hip extension and abduction strength to improve steadiness with gait, and standing tasks, and decrease fall risk.    Time  8    Period  Weeks    Status  Achieved    Target Date  12/31/19      PT LONG TERM GOAL #2   Title  Patient will improve her DGI balance score to 19/24 or more as a demonstration of improved balance.    Baseline  13/24 DGI (11/02/2019); 19/24 (12/07/2019)    Time  8    Period  Weeks    Status  Achieved    Target Date  12/31/19      PT LONG TERM GOAL #3   Title  Patient will have a decrease in low back pain to 4/10 or less at worst to promote ability to peform standing tasks such as washing dishes or sweeping the floor more comfortably    Baseline  7/10 low back pain at most for the past 3 months (11/02/2019); 2/10 low back pain at most for the past 7 days (12/01/2019), (12/14/2019)    Time  8    Period  Weeks    Status  Achieved    Target  Date  12/31/19      PT LONG TERM GOAL #4   Title  Pt will be able to step over a shoe box and clear both feet independently at least 10x each LE to improve balance and decrease fall risk.    Baseline  Unable to clear shoe box when stepping over it (11/02/2019).    Time  8    Period  Weeks    Status  On-going    Target Date  12/31/19      PT LONG TERM GOAL #5   Title  Pt will report decreased to no difficulty with stair negotiation with one UE assist (4 regular steps) to promote ability to get into and out of her house more easily.    Baseline  Difficulty with stair negotiation secondary to fear of falling (11/02/2019); difficulty secondary to fear of falling. No difficulty wiht B UE assist. Difficulty when carrying something without UE assist (12/01/2019)    Time  8    Period  Weeks    Status  Partially Met    Target Date  12/31/19            Plan - 12/24/19 1217    Clinical Impression Statement  Pt arrived late so session adjusted accordingly. Continued working on improving glute med and trunk muscle strength to promote single leg stance balance when stepping over the contralateral foot. Pt also improved ability to step over mini hurdles after cues to transfer her center of gravity onto her foot in front as well as improving knee flexion of back foot for foot clearance. Pt tolerated session well without aggravation of symptoms. Good muscle use felt with exercises. Pt will benefit from continued skilled physical therapy services to improve strength, balance, and function.    Personal Factors and Comorbidities  Age;Comorbidity 3+;Fitness;Past/Current Experience;Time since onset of injury/illness/exacerbation    Comorbidities  B total shoulder, B total knee replacements, low back pain with radiating symptoms, L knee pain, neck pain    Examination-Activity Limitations  Lift;Stairs;Carry    Stability/Clinical Decision Making  Stable/Uncomplicated    Rehab Potential  Fair    PT Frequency  2x  / week    PT Duration  8 weeks    PT Treatment/Interventions  Therapeutic activities;Gait training;Stair training;Functional mobility training;Therapeutic exercise;Balance training;Neuromuscular re-education;Patient/family education;Manual techniques;Dry needling;Aquatic Therapy;Canalith Repostioning;Electrical Stimulation;Iontophoresis 19m/ml Dexamethasone;Vestibular    PT Next Visit Plan  posture, scapular strengthening, thoracic extension, trunk, glute strengthening, femoral control, balance, manual techniques, modalities PRN    Consulted and Agree with Plan of Care  Patient       Patient will benefit from skilled therapeutic intervention in order to improve the following deficits and impairments:  Pain, Postural dysfunction, Improper body mechanics, Difficulty walking, Decreased strength, Decreased balance, Abnormal gait  Visit Diagnosis: Unsteadiness on feet  Difficulty in walking, not elsewhere classified  Muscle weakness (generalized)  History of falling     Problem List Patient Active Problem List   Diagnosis Date Noted  . Influenza A 08/15/2016    MJoneen BoersPT, DPT   12/24/2019, 12:18 PM  [facility] [address] [place] [address]Phone: [phone]  Fax:  [phone] Name: BJaisha VillacresMRN: [id number] of Birth: 1April 10, 1947

## 2019-12-28 ENCOUNTER — Ambulatory Visit: Payer: Medicare Other

## 2019-12-28 ENCOUNTER — Other Ambulatory Visit: Payer: Self-pay

## 2019-12-28 DIAGNOSIS — R2681 Unsteadiness on feet: Secondary | ICD-10-CM

## 2019-12-28 DIAGNOSIS — Z9181 History of falling: Secondary | ICD-10-CM

## 2019-12-28 DIAGNOSIS — M6281 Muscle weakness (generalized): Secondary | ICD-10-CM

## 2019-12-28 DIAGNOSIS — R262 Difficulty in walking, not elsewhere classified: Secondary | ICD-10-CM

## 2019-12-28 NOTE — Therapy (Signed)
[facility] [address] [place], [address] Phone: [phone]   Fax:  [phone]  Physical Therapy Treatment  Patient Details  Name: Tara Moody MRN: [id number] Date of Birth: 1946-08-05 Referring Provider (PT): Angelena Form, MD   Encounter Date: 12/28/2019  PT End of Session - 12/28/19 1131    Visit Number  14    Number of Visits  17    Date for PT Re-Evaluation  12/31/19    Authorization Type  4    Authorization Time Period  of 10 progress report    PT Start Time  1131   pt arrived late   PT Stop Time  1201    PT Time Calculation (min)  30 min    Activity Tolerance  Patient tolerated treatment well    Behavior During Therapy  [facility] for tasks assessed/performed       Past Medical History:  Diagnosis Date  . Hypertension     Past Surgical History:  Procedure Laterality Date  . CATARACT EXTRACTION    . REPLACEMENT TOTAL KNEE BILATERAL      There were no vitals filed for this visit.  Subjective Assessment - 12/28/19 1132    Subjective  Doing pretty good. Has a little tightness in her back but has been doing some work around the house. Has been going up the steps alternating feet.    Pertinent History  Gait abnormality. Pt has had B knee, B shoulder replacements, B carpal tunnel surgeries. Pt hurt her L knee 2001. 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 Husband had a TBI and pt had take care of him until his death. Has had bouts of her L knee hurting. In the last 3 years, pt has been falling more. Fell 5 times in August 2020. Was outside walking the dogs and has had a hard time picking up her feet (has had that). Feels more tired since having COVID.  Has not fallen in the past 2 months because she has been paying attention. Fell up the steps which knocked the wind out of her in 09/2019.  Had COVID end of August/beginning September 2020. Wants to prevent falls. [address] one more time in March 2021 when  her dogs (jack russel and beagle; pt was walking them at the same time). Pt was on a road with little traffic but it has hills. Pt did not raise her R foot high enough and tripped on a step when pt fell in February 2021. Wonders if her R foot if shorter. When pt fell in August 2020, pt states that she tripped every time. Pt now able to catch herself when she trips. Kneels on her L knee to stand up from a fall. Pt reports tripping on R foot more than the L.  Pt has a house in [place]. Pt currently living in [place]. Planning on returning to [place] in September 2021. [place]: pt lives alone in a 1 story home. 3 steps front door with B rail, 4 steps back door, B rails. [place] home: 1 story home, 1 small step to enter, no rails (not a problem per pt). Same for back door.    Patient Stated Goals  Be better able to perform floor to stand transfers.    Currently in Pain?  No/denies    Pain Score  0-No pain    Pain Onset  More than a month ago  PT Education - 12/28/19 1136    Education Details  ther-ex    Person(s) Educated  Patient    Methods  Explanation;Demonstration;Tactile cues;Verbal cues    Comprehension  Returned demonstration;Verbalized understanding      MedbridgeAccess Code [email or web address]  Gait: decreased stance L LE    Therapeutic exercise  Slight pain in L head trying to get to her appointment Blood pressure L arm sitting, mechanically taken, normal cuff: 106/65, HR 59  S/L hip abduction  R 10x3.  L 10x3.   S/L clamshell             L 10x3             R 10x3  Bridge 4x, B posterior thigh discomfort.   Supine posterior pelvic tilt 10x3 with 5 second holds   Static lunge with one UE assist to promote hip and knee extension strength for floor to stand transfers  R 10x2  L 10x2  Ascending and descending 4 regular steps without UE assist 3x, reciprocal pattern.   SBA to CGA. LOB x 1 when  on L LE ascending stairs. Slight unsteadiness descending stairs secondary to weakness.   Improved exercise technique, movement at target joints, use of target muscles after mod verbal, visual, tactile cues.     Clinical impression/pt response: Pt arrived late so session adjusted accordingly. Continued working on improving glute med and trunk muscle strength to promote single leg stance balance when stepping over the contralateral foot. Also worked on lunges to promote strength with floor to stand transfers if necessary. Pt able to ascend and descend 4 regular steps 3 times without UE assist in a reciprocal pattern. Some unsteadiness observed with descending stairs as well as LOB x 1 ascending stairs secondary to weakness, especially with L glute med during stance phase. Pt tolerated session well without aggravation of symptoms. Pt will benefit from continued skilled physical therapy services to improve strength, balance, and function.      PT Short Term Goals - 12/14/19 1458      PT SHORT TERM GOAL #1   Title  Pt will be independent with her HEP to improve balance and decrease fall risk.    Baseline  Pt performing her HEP (12/14/2019)    Time  3    Period  Weeks    Status  Achieved    Target Date  12/17/19        PT Long Term Goals - 12/14/19 1322      PT LONG TERM GOAL #1   Title  Patient will improve bilateral hip extension and abduction strength to improve steadiness with gait, and standing tasks, and decrease fall risk.    Time  8    Period  Weeks    Status  Achieved    Target Date  12/31/19      PT LONG TERM GOAL #2   Title  Patient will improve her DGI balance score to 19/24 or more as a demonstration of improved balance.    Baseline  13/24 DGI (11/02/2019); 19/24 (12/07/2019)    Time  8    Period  Weeks    Status  Achieved    Target Date  12/31/19      PT LONG TERM GOAL #3   Title  Patient will have a decrease in low back pain to 4/10 or less at worst to promote  ability to peform standing tasks such as washing dishes or sweeping the floor more comfortably    Baseline  7/10 low back pain at most for the past 3 months (11/02/2019); 2/10 low back pain at most for the past 7 days (12/01/2019), (12/14/2019)    Time  8    Period  Weeks    Status  Achieved    Target Date  12/31/19      PT LONG TERM GOAL #4   Title  Pt will be able to step over a shoe box and clear both feet independently at least 10x each LE to improve balance and decrease fall risk.    Baseline  Unable to clear shoe box when stepping over it (11/02/2019).    Time  8    Period  Weeks    Status  On-going    Target Date  12/31/19      PT LONG TERM GOAL #5   Title  Pt will report decreased to no difficulty with stair negotiation with one UE assist (4 regular steps) to promote ability to get into and out of her house more easily.    Baseline  Difficulty with stair negotiation secondary to fear of falling (11/02/2019); difficulty secondary to fear of falling. No difficulty wiht B UE assist. Difficulty when carrying something without UE assist (12/01/2019)    Time  8    Period  Weeks    Status  Partially Met    Target Date  12/31/19            Plan - 12/28/19 1130    Clinical Impression Statement  Pt arrived late so session adjusted accordingly. Continued working on improving glute med and trunk muscle strength to promote single leg stance balance when stepping over the contralateral foot. Also worked on lunges to promote strength with floor to stand transfers if necessary. Pt able to ascend and descend 4 regular steps 3 times without UE assist in a reciprocal pattern. Some unsteadiness observed with descending stairs as well as LOB x 1 ascending stairs secondary to weakness, especially with L glute med during stance phase. Pt tolerated session well without aggravation of symptoms. Pt will benefit from continued skilled physical therapy services to improve strength, balance, and function.     Personal Factors and Comorbidities  Age;Comorbidity 3+;Fitness;Past/Current Experience;Time since onset of injury/illness/exacerbation    Comorbidities  B total shoulder, B total knee replacements, low back pain with radiating symptoms, L knee pain, neck pain    Examination-Activity Limitations  Lift;Stairs;Carry    Stability/Clinical Decision Making  Stable/Uncomplicated    Rehab Potential  Fair    PT Frequency  2x / week    PT Duration  8 weeks    PT Treatment/Interventions  Therapeutic activities;Gait training;Stair training;Functional mobility training;Therapeutic exercise;Balance training;Neuromuscular re-education;Patient/family education;Manual techniques;Dry needling;Aquatic Therapy;Canalith Repostioning;Electrical Stimulation;Iontophoresis 38m/ml Dexamethasone;Vestibular    PT Next Visit Plan  posture, scapular strengthening, thoracic extension, trunk, glute strengthening, femoral control, balance, manual techniques, modalities PRN    Consulted and Agree with Plan of Care  Patient       Patient will benefit from skilled therapeutic intervention in order to improve the following deficits and impairments:  Pain, Postural dysfunction, Improper body mechanics, Difficulty walking, Decreased strength, Decreased balance, Abnormal gait  Visit Diagnosis: Unsteadiness on feet  Difficulty in walking, not elsewhere classified  Muscle weakness (generalized)  History of falling     Problem List Patient Active Problem List   Diagnosis Date Noted  . Influenza A 08/15/2016    MJoneen BoersPT, DPT   12/28/2019, 12:14 PM  [facility] [address], [place], [address] Phone: [phone]   Fax:  [phone]  Name: Tara Moody MRN: [id number] Date of Birth: 1946/06/16

## 2019-12-31 ENCOUNTER — Ambulatory Visit: Payer: Medicare Other

## 2019-12-31 ENCOUNTER — Other Ambulatory Visit: Payer: Self-pay

## 2019-12-31 DIAGNOSIS — R2681 Unsteadiness on feet: Secondary | ICD-10-CM | POA: Diagnosis not present

## 2019-12-31 DIAGNOSIS — M6281 Muscle weakness (generalized): Secondary | ICD-10-CM

## 2019-12-31 DIAGNOSIS — R262 Difficulty in walking, not elsewhere classified: Secondary | ICD-10-CM

## 2019-12-31 DIAGNOSIS — Z9181 History of falling: Secondary | ICD-10-CM

## 2019-12-31 NOTE — Therapy (Signed)
[facility] [address], [place], [address] Phone: [phone]   Fax:  [phone]  Physical Therapy Treatment  Patient Details  Name: Tara Moody MRN: [id number] Date of Birth: [birth year] Referring Provider (PT): Angelena Form, MD   Encounter Date: 12/31/2019  PT End of Session - 12/31/19 1435    Visit Number  15    Number of Visits  27    Date for PT Re-Evaluation  02/04/20    Authorization Type  5    Authorization Time Period  of 10 progress report    PT Start Time  1434    PT Stop Time  1514    PT Time Calculation (min)  40 min    Activity Tolerance  Patient tolerated treatment well    Behavior During Therapy  [facility] for tasks assessed/performed       Past Medical History:  Diagnosis Date  . Hypertension     Past Surgical History:  Procedure Laterality Date  . CATARACT EXTRACTION    . REPLACEMENT TOTAL KNEE BILATERAL      There were no vitals filed for this visit.  Subjective Assessment - 12/31/19 1436    Subjective  Doing pretty good. No pain currently. Pt states that she has improved but does not feel like she is as far as she wants to be. Likes having PT to correct stuff that she does.    Pertinent History  Gait abnormality. Pt has had B knee, B shoulder replacements, B carpal tunnel surgeries. Pt hurt her L knee 2001. 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 Husband had a TBI and pt had take care of him until his death. Has had bouts of her L knee hurting. In the last 3 years, pt has been falling more. Fell 5 times in August 2020. Was outside walking the dogs and has had a hard time picking up her feet (has had that). Feels more tired since having COVID.  Has not fallen in the past 2 months because she has been paying attention. Fell up the steps which knocked the wind out of her in 09/2019.  Had COVID end of August/beginning September 2020. Wants to prevent falls. [address] one more time in March  2021 when her dogs (jack russel and beagle; pt was walking them at the same time). Pt was on a road with little traffic but it has hills. Pt did not raise her R foot high enough and tripped on a step when pt fell in February 2021. Wonders if her R foot if shorter. When pt fell in August 2020, pt states that she tripped every time. Pt now able to catch herself when she trips. Kneels on her L knee to stand up from a fall. Pt reports tripping on R foot more than the L.  Pt has a house in [place]. Pt currently living in [place]. Planning on returning to [place] in September 2021. [place]: pt lives alone in a 1 story home. 3 steps front door with B rail, 4 steps back door, B rails. [place] home: 1 story home, 1 small step to enter, no rails (not a problem per pt). Same for back door.    Patient Stated Goals  Be better able to perform floor to stand transfers.    Currently in Pain?  No/denies    Pain Score  0-No pain    Pain Onset  More than a month ago  PT Education - 12/31/19 1441    Education Details  ther-ex    Person(s) Educated  Patient    Methods  Explanation;Demonstration;Tactile cues;Verbal cues    Comprehension  Returned demonstration;Verbalized understanding      MedbridgeAccess Code [email or web address]  Gait: decreased stance L LE    Therapeutic exercise  Reviewed plan of care: continue 2x/week for 5 weeks   S/L hip abduction  R 10x3.  L 10x3.   S/L clamshell L 10x3 R 10x3   Supine posterior pelvic tilt 10x3 with 5 second holds  Static lunge with one UE assist to promote hip and knee extension strength for floor to stand transfers             R 10x2             L 10x2  Stepping over shoe box 10x each LE. Able to clear shoe box about 65% of the time.   Demonstrates glute med weakness with SLS balance.   SLS without UE assist to light touch assist   R 10x5  seconds  L 10x5 seconds  Improved exercise technique, movement at target joints, use of target muscles after mod verbal, visual, tactile cues.     Clinical impression/pt response: Pt demonstrates overall improved strength, balance, and decreased back pain. Pt still has difficulty stepping over obstacles and negotiating stairs without UE assist in which glute med weakness may play a factor. Pt will benefit from continued skilled physical therapy services to improve strength, balance, function, and ability to step over obstacles and negotiate stairs.    PT Short Term Goals - 12/14/19 1458      PT SHORT TERM GOAL #1   Title  Pt will be independent with her HEP to improve balance and decrease fall risk.    Baseline  Pt performing her HEP (12/14/2019)    Time  3    Period  Weeks    Status  Achieved    Target Date  12/17/19        PT Long Term Goals - 12/31/19 1505      PT LONG TERM GOAL #1   Title  Patient will improve bilateral hip extension and abduction strength to improve steadiness with gait, and standing tasks, and decrease fall risk.    Time  8    Period  Weeks    Status  Achieved    Target Date  12/31/19      PT LONG TERM GOAL #2   Title  Patient will improve her DGI balance score to 19/24 or more as a demonstration of improved balance.    Baseline  13/24 DGI (11/02/2019); 19/24 (12/07/2019)    Time  8    Period  Weeks    Status  Achieved    Target Date  12/31/19      PT LONG TERM GOAL #3   Title  Patient will have a decrease in low back pain to 4/10 or less at worst to promote ability to peform standing tasks such as washing dishes or sweeping the floor more comfortably    Baseline  7/10 low back pain at most for the past 3 months (11/02/2019); 2/10 low back pain at most for the past 7 days (12/01/2019), (12/14/2019)    Time  8    Period  Weeks    Status  Achieved    Target Date  12/31/19      PT LONG TERM GOAL #4   Title  Pt will be able  to step over a shoe box  and clear both feet independently at least 10x each LE to improve balance and decrease fall risk.    Baseline  Unable to clear shoe box when stepping over it (11/02/2019).Able to clear shoe box about 65% of the time. (12/31/2019)    Time  5    Period  Weeks    Status  On-going    Target Date  02/04/20      PT LONG TERM GOAL #5   Title  Pt will report decreased to no difficulty with stair negotiation with one UE assist (4 regular steps) to promote ability to get into and out of her house more easily.    Baseline  Difficulty with stair negotiation secondary to fear of falling (11/02/2019); difficulty secondary to fear of falling. No difficulty wiht B UE assist. Difficulty when carrying something without UE assist (12/01/2019)    Time  5    Period  Weeks    Status  Partially Met    Target Date  02/04/20            Plan - 12/31/19 1441    Clinical Impression Statement  Pt demonstrates overall improved strength, balance, and decreased back pain. Pt still has difficulty stepping over obstacles and negotiating stairs without UE assist in which glute med weakness may play a factor. Pt will benefit from continued skilled physical therapy services to improve strength, balance, function, and ability to step over obstacles and negotiate stairs.    Personal Factors and Comorbidities  Age;Comorbidity 3+;Fitness;Past/Current Experience;Time since onset of injury/illness/exacerbation    Comorbidities  B total shoulder, B total knee replacements, low back pain with radiating symptoms, L knee pain, neck pain    Examination-Activity Limitations  Lift;Stairs;Carry    Stability/Clinical Decision Making  Stable/Uncomplicated    Clinical Decision Making  Low    Rehab Potential  Fair    PT Frequency  2x / week    PT Duration  --   5 weeks   PT Treatment/Interventions  Therapeutic activities;Gait training;Stair training;Functional mobility training;Therapeutic exercise;Balance training;Neuromuscular  re-education;Patient/family education;Manual techniques;Dry needling;Aquatic Therapy;Canalith Repostioning;Electrical Stimulation;Iontophoresis 58m/ml Dexamethasone;Vestibular    PT Next Visit Plan  posture, scapular strengthening, thoracic extension, trunk, glute strengthening, femoral control, balance, manual techniques, modalities PRN    Consulted and Agree with Plan of Care  Patient       Patient will benefit from skilled therapeutic intervention in order to improve the following deficits and impairments:  Pain, Postural dysfunction, Improper body mechanics, Difficulty walking, Decreased strength, Decreased balance, Abnormal gait  Visit Diagnosis: Unsteadiness on feet - Plan: PT plan of care cert/re-cert  Difficulty in walking, not elsewhere classified - Plan: PT plan of care cert/re-cert  Muscle weakness (generalized) - Plan: PT plan of care cert/re-cert  History of falling - Plan: PT plan of care cert/re-cert     Problem List Patient Active Problem List   Diagnosis Date Noted  . Influenza A 08/15/2016    MJoneen BoersPT, DPT   12/31/2019, 6:01 PM  [facility] [address] [place] [address]Phone: [phone]  Fax:  [phone] Name: Tara CadmusMRN: [id number] of Birth: 1May 03, 1947

## 2020-01-05 ENCOUNTER — Ambulatory Visit: Payer: Medicare Other | Attending: Infectious Diseases

## 2020-01-05 ENCOUNTER — Other Ambulatory Visit: Payer: Self-pay

## 2020-01-05 DIAGNOSIS — Z9181 History of falling: Secondary | ICD-10-CM | POA: Diagnosis present

## 2020-01-05 DIAGNOSIS — R262 Difficulty in walking, not elsewhere classified: Secondary | ICD-10-CM | POA: Diagnosis present

## 2020-01-05 DIAGNOSIS — M6281 Muscle weakness (generalized): Secondary | ICD-10-CM | POA: Diagnosis present

## 2020-01-05 DIAGNOSIS — R2681 Unsteadiness on feet: Secondary | ICD-10-CM | POA: Insufficient documentation

## 2020-01-05 NOTE — Therapy (Signed)
[facility] [address], [place], [address] Phone: [phone]   Fax:  [phone]  Physical Therapy Treatment  Patient Details  Name: Tara Moody MRN: [id number] Date of Birth: 01/02/1946 Referring Provider (PT): Angelena Form, MD   Encounter Date: 01/05/2020  PT End of Session - 01/05/20 1449    Visit Number  16    Number of Visits  27    Date for PT Re-Evaluation  02/04/20    Authorization Type  6    Authorization Time Period  of 10 progress report    PT Start Time  1449    PT Stop Time  1516    PT Time Calculation (min)  27 min    Activity Tolerance  Patient tolerated treatment well    Behavior During Therapy  [facility] for tasks assessed/performed       Past Medical History:  Diagnosis Date  . Hypertension     Past Surgical History:  Procedure Laterality Date  . CATARACT EXTRACTION    . REPLACEMENT TOTAL KNEE BILATERAL      There were no vitals filed for this visit.  Subjective Assessment - 01/05/20 1450    Subjective  Some days, pt does quite well with balance and walking, other times, resorts back to old habits. Was worse prior to PT.    Pertinent History  Gait abnormality. Pt has had B knee, B shoulder replacements, B carpal tunnel surgeries. Pt hurt her L knee 2001. 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 Husband had a TBI and pt had take care of him until his death. Has had bouts of her L knee hurting. In the last 3 years, pt has been falling more. Fell 5 times in August 2020. Was outside walking the dogs and has had a hard time picking up her feet (has had that). Feels more tired since having COVID.  Has not fallen in the past 2 months because she has been paying attention. Fell up the steps which knocked the wind out of her in 09/2019.  Had COVID end of August/beginning September 2020. Wants to prevent falls. [address] one more time in March 2021 when her dogs (jack russel and beagle; pt was  walking them at the same time). Pt was on a road with little traffic but it has hills. Pt did not raise her R foot high enough and tripped on a step when pt fell in February 2021. Wonders if her R foot if shorter. When pt fell in August 2020, pt states that she tripped every time. Pt now able to catch herself when she trips. Kneels on her L knee to stand up from a fall. Pt reports tripping on R foot more than the L.  Pt has a house in [place]. Pt currently living in [place]. Planning on returning to [place] in September 2021. [place]: pt lives alone in a 1 story home. 3 steps front door with B rail, 4 steps back door, B rails. [place] home: 1 story home, 1 small step to enter, no rails (not a problem per pt). Same for back door.    Patient Stated Goals  Be better able to perform floor to stand transfers.    Currently in Pain?  No/denies    Pain Onset  More than a month ago  MedbridgeAccess Code [email or web address]  Gait: decreased stance L LE    Therapeutic exercise   S/L hip abduction  R 10x3.  L 10x3.   S/L clamshell L 10x3 R 10x3  Supine posterior pelvic tilt 5x3 with 10 second holds  SLS without UE assist to light touch assist as needed             R 5x10 seconds             L 5x10 seconds     Stepping over 6 mini hurdles 4x alternating LE. Able to perform today without LOB. Slight unsteadiness. Cues for foot clearance and step length to clear obstacle.    Static lunge with one UE assist to promote hip and knee extension strength for floor to stand transfers R 10x L 10x    Improved exercise technique, movement at target joints, use of target muscles after mod verbal, visual, tactile cues.     Clinical impression/pt response: Improving ability to negotiate obstacles observed. Continued working in improving glute med muscle strength  to promote single leg balance strength for obstacle negotiation and decrease fall risk. Pt tolerated session well without aggravation of symptoms. Pt will benefit from continued skilled physical therapy services to improve strength, balance, and function.      PT Education - 01/05/20 1452    Education Details  ther-ex    Person(s) Educated  Patient    Methods  Explanation;Demonstration;Tactile cues;Verbal cues    Comprehension  Returned demonstration;Verbalized understanding       PT Short Term Goals - 12/14/19 1458      PT SHORT TERM GOAL #1   Title  Pt will be independent with her HEP to improve balance and decrease fall risk.    Baseline  Pt performing her HEP (12/14/2019)    Time  3    Period  Weeks    Status  Achieved    Target Date  12/17/19        PT Long Term Goals - 12/31/19 1505      PT LONG TERM GOAL #1   Title  Patient will improve bilateral hip extension and abduction strength to improve steadiness with gait, and standing tasks, and decrease fall risk.    Time  8    Period  Weeks    Status  Achieved    Target Date  12/31/19      PT LONG TERM GOAL #2   Title  Patient will improve her DGI balance score to 19/24 or more as a demonstration of improved balance.    Baseline  13/24 DGI (11/02/2019); 19/24 (12/07/2019)    Time  8    Period  Weeks    Status  Achieved    Target Date  12/31/19      PT LONG TERM GOAL #3   Title  Patient will have a decrease in low back pain to 4/10 or less at worst to promote ability to peform standing tasks such as washing dishes or sweeping the floor more comfortably    Baseline  7/10 low back pain at most for the past 3 months (11/02/2019); 2/10 low back pain at most for the past 7 days (12/01/2019), (12/14/2019)    Time  8    Period  Weeks    Status  Achieved    Target Date  12/31/19      PT LONG TERM GOAL #4   Title  Pt will be able to step over a shoe box and clear  both feet independently at least 10x each LE to improve balance and  decrease fall risk.    Baseline  Unable to clear shoe box when stepping over it (11/02/2019).Able to clear shoe box about 65% of the time. (12/31/2019)    Time  5    Period  Weeks    Status  On-going    Target Date  02/04/20      PT LONG TERM GOAL #5   Title  Pt will report decreased to no difficulty with stair negotiation with one UE assist (4 regular steps) to promote ability to get into and out of her house more easily.    Baseline  Difficulty with stair negotiation secondary to fear of falling (11/02/2019); difficulty secondary to fear of falling. No difficulty wiht B UE assist. Difficulty when carrying something without UE assist (12/01/2019)    Time  5    Period  Weeks    Status  Partially Met    Target Date  02/04/20            Plan - 01/05/20 1455    Clinical Impression Statement  Improving ability to negotiate obstacles observed. Continued working in improving glute med muscle strength to promote single leg balance strength for obstacle negotiation and decrease fall risk. Pt tolerated session well without aggravation of symptoms. Pt will benefit from continued skilled physical therapy services to improve strength, balance, and function.    Personal Factors and Comorbidities  Age;Comorbidity 3+;Fitness;Past/Current Experience;Time since onset of injury/illness/exacerbation    Comorbidities  B total shoulder, B total knee replacements, low back pain with radiating symptoms, L knee pain, neck pain    Examination-Activity Limitations  Lift;Stairs;Carry    Stability/Clinical Decision Making  Stable/Uncomplicated    Rehab Potential  Fair    PT Frequency  2x / week    PT Duration  --   5 weeks   PT Treatment/Interventions  Therapeutic activities;Gait training;Stair training;Functional mobility training;Therapeutic exercise;Balance training;Neuromuscular re-education;Patient/family education;Manual techniques;Dry needling;Aquatic Therapy;Canalith Repostioning;Electrical  Stimulation;Iontophoresis 24m/ml Dexamethasone;Vestibular    PT Next Visit Plan  posture, scapular strengthening, thoracic extension, trunk, glute strengthening, femoral control, balance, manual techniques, modalities PRN    Consulted and Agree with Plan of Care  Patient       Patient will benefit from skilled therapeutic intervention in order to improve the following deficits and impairments:  Pain, Postural dysfunction, Improper body mechanics, Difficulty walking, Decreased strength, Decreased balance, Abnormal gait  Visit Diagnosis: Unsteadiness on feet  Difficulty in walking, not elsewhere classified  Muscle weakness (generalized)  History of falling     Problem List Patient Active Problem List   Diagnosis Date Noted  . Influenza A 08/15/2016    MJoneen BoersPT, DPT   01/05/2020, 5:59 PM  [facility] [address] [place] [address]Phone: [phone]  Fax:  [phone] Name: BBridgett HattabaughMRN: [id number] of Birth: 1Jan 12, 1947

## 2020-01-12 ENCOUNTER — Ambulatory Visit: Payer: Medicare Other

## 2020-01-14 ENCOUNTER — Other Ambulatory Visit: Payer: Self-pay

## 2020-01-14 ENCOUNTER — Ambulatory Visit: Payer: Medicare Other

## 2020-01-14 DIAGNOSIS — R262 Difficulty in walking, not elsewhere classified: Secondary | ICD-10-CM

## 2020-01-14 DIAGNOSIS — M6281 Muscle weakness (generalized): Secondary | ICD-10-CM

## 2020-01-14 DIAGNOSIS — R2681 Unsteadiness on feet: Secondary | ICD-10-CM | POA: Diagnosis not present

## 2020-01-14 DIAGNOSIS — Z9181 History of falling: Secondary | ICD-10-CM

## 2020-01-14 NOTE — Therapy (Signed)
[facility] [address] [place], [address] Phone: [phone]   Fax:  [phone]  Physical Therapy Treatment  Patient Details  Name: Tara Moody MRN: [id number] Date of Birth: May 16, 1946 Referring Provider (PT): Angelena Form, MD   Encounter Date: 01/14/2020   PT End of Session - 01/14/20 1551    Visit Number 17    Number of Visits 27    Date for PT Re-Evaluation 02/04/20    Authorization Type 7    Authorization Time Period of 10 progress report    PT Start Time 1551    PT Stop Time 1631    PT Time Calculation (min) 40 min    Activity Tolerance Patient tolerated treatment well    Behavior During Therapy [facility] for tasks assessed/performed           Past Medical History:  Diagnosis Date  . Hypertension     Past Surgical History:  Procedure Laterality Date  . CATARACT EXTRACTION    . REPLACEMENT TOTAL KNEE BILATERAL      There were no vitals filed for this visit.   Subjective Assessment - 01/14/20 1553    Subjective Doing pretty good. Walking is pretty good. Feels stiff and sore some times in her back. Sleeping flat on her back, her back feels better but back does not feel as good.    Pertinent History Gait abnormality. Pt has had B knee, B shoulder replacements, B carpal tunnel surgeries. Pt hurt her L knee 2001. 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 Husband had a TBI and pt had take care of him until his death. Has had bouts of her L knee hurting. In the last 3 years, pt has been falling more. Fell 5 times in August 2020. Was outside walking the dogs and has had a hard time picking up her feet (has had that). Feels more tired since having COVID.  Has not fallen in the past 2 months because she has been paying attention. Fell up the steps which knocked the wind out of her in 09/2019.  Had COVID end of August/beginning September 2020. Wants to prevent falls. [address] one more time in March 2021 when  her dogs (jack russel and beagle; pt was walking them at the same time). Pt was on a road with little traffic but it has hills. Pt did not raise her R foot high enough and tripped on a step when pt fell in February 2021. Wonders if her R foot if shorter. When pt fell in August 2020, pt states that she tripped every time. Pt now able to catch herself when she trips. Kneels on her L knee to stand up from a fall. Pt reports tripping on R foot more than the L.  Pt has a house in [place]. Pt currently living in [place]. Planning on returning to [place] in September 2021. [place]: pt lives alone in a 1 story home. 3 steps front door with B rail, 4 steps back door, B rails. [place] home: 1 story home, 1 small step to enter, no rails (not a problem per pt). Same for back door.    Patient Stated Goals Be better able to perform floor to stand transfers.    Currently in Pain? No/denies    Pain Onset More than a month ago  PT Education - 01/14/20 1613    Education Details ther-ex    Person(s) Educated Patient    Methods Explanation;Tactile cues;Demonstration;Verbal cues    Comprehension Returned demonstration;Verbalized understanding          MedbridgeAccess Code [email or web address]  Gait: decreased stance L LE    Therapeutic exercise   S/L hip abduction  R 10x3.  L 10x3.   Supine posterior pelvic tilt 5x3 with 10 second holds   Static lunge with one UE assist to promote hip and knee extension strength for floor to stand transfers R 10x L 10x   Floor to stand transfers with chair assist CGA with PT 3x  Able to perform with CGA using chair   SLS without UE assist to light touch assist as needed R 5x10 seconds for 2 sets L 5x10 seconds for 2 sets    Stepping over 6 mini hurdles 6x alternating LE. Able to perform with only 1 LOB. Min A to recover.  Improved overall steadiness observed  Forward step up onto 6 inch step without UE assist, CGA  R 5x  L 5x  Forward step up onto and over 6 inch step no UE assist, CGA  R 5x  L 5x  Improved exercise technique, movement at target joints, use of target muscles after mod verbal, visual, tactile cues.     Clinical impression/pt response: Continued working on improving overall LE strength, especially her glute med muscles to promote ability to stand on one leg as pt clears obstacles as well as improve ability to step up onto a step and off and perform floor to stand transfers. Able to perform floor to stand transfers with B UE assist on chair. Pt tolerated session well without aggravation of symptoms. Pt will benefit from continued skilled physical therapy services to improve strength, balance, and function.      PT Short Term Goals - 12/14/19 1458      PT SHORT TERM GOAL #1   Title Pt will be independent with her HEP to improve balance and decrease fall risk.    Baseline Pt performing her HEP (12/14/2019)    Time 3    Period Weeks    Status Achieved    Target Date 12/17/19             PT Long Term Goals - 12/31/19 1505      PT LONG TERM GOAL #1   Title Patient will improve bilateral hip extension and abduction strength to improve steadiness with gait, and standing tasks, and decrease fall risk.    Time 8    Period Weeks    Status Achieved    Target Date 12/31/19      PT LONG TERM GOAL #2   Title Patient will improve her DGI balance score to 19/24 or more as a demonstration of improved balance.    Baseline 13/24 DGI (11/02/2019); 19/24 (12/07/2019)    Time 8    Period Weeks    Status Achieved    Target Date 12/31/19      PT LONG TERM GOAL #3   Title Patient will have a decrease in low back pain to 4/10 or less at worst to promote ability to peform standing tasks such as washing dishes or sweeping the floor more comfortably    Baseline 7/10 low back pain at most for  the past 3 months (11/02/2019); 2/10 low back pain at most for the past 7 days (12/01/2019), (12/14/2019)    Time 8  Period Weeks    Status Achieved    Target Date 12/31/19      PT LONG TERM GOAL #4   Title Pt will be able to step over a shoe box and clear both feet independently at least 10x each LE to improve balance and decrease fall risk.    Baseline Unable to clear shoe box when stepping over it (11/02/2019).Able to clear shoe box about 65% of the time. (12/31/2019)    Time 5    Period Weeks    Status On-going    Target Date 02/04/20      PT LONG TERM GOAL #5   Title Pt will report decreased to no difficulty with stair negotiation with one UE assist (4 regular steps) to promote ability to get into and out of her house more easily.    Baseline Difficulty with stair negotiation secondary to fear of falling (11/02/2019); difficulty secondary to fear of falling. No difficulty wiht B UE assist. Difficulty when carrying something without UE assist (12/01/2019)    Time 5    Period Weeks    Status Partially Met    Target Date 02/04/20                 Plan - 01/14/20 1600    Clinical Impression Statement Continued working on improving overall LE strength, especially her glute med muscles to promote ability to stand on one leg as pt clears obstacles as well as improve ability to step up onto a step and off and perform floor to stand transfers. Able to perform floor to stand transfers with B UE assist on chair. Pt tolerated session well without aggravation of symptoms. Pt will benefit from continued skilled physical therapy services to improve strength, balance, and function.    Personal Factors and Comorbidities Age;Comorbidity 3+;Fitness;Past/Current Experience;Time since onset of injury/illness/exacerbation    Comorbidities B total shoulder, B total knee replacements, low back pain with radiating symptoms, L knee pain, neck pain    Examination-Activity Limitations Lift;Stairs;Carry     Stability/Clinical Decision Making Stable/Uncomplicated    Rehab Potential Fair    PT Frequency 2x / week    PT Duration --   5 weeks   PT Treatment/Interventions Therapeutic activities;Gait training;Stair training;Functional mobility training;Therapeutic exercise;Balance training;Neuromuscular re-education;Patient/family education;Manual techniques;Dry needling;Aquatic Therapy;Canalith Repostioning;Electrical Stimulation;Iontophoresis 52m/ml Dexamethasone;Vestibular    PT Next Visit Plan posture, scapular strengthening, thoracic extension, trunk, glute strengthening, femoral control, balance, manual techniques, modalities PRN    Consulted and Agree with Plan of Care Patient           Patient will benefit from skilled therapeutic intervention in order to improve the following deficits and impairments:  Pain, Postural dysfunction, Improper body mechanics, Difficulty walking, Decreased strength, Decreased balance, Abnormal gait  Visit Diagnosis: Unsteadiness on feet  Difficulty in walking, not elsewhere classified  Muscle weakness (generalized)  History of falling     Problem List Patient Active Problem List   Diagnosis Date Noted  . Influenza A 08/15/2016   MJoneen BoersPT, DPT   01/14/2020, 4:50 PM  [place] [facility] [address] [place] [address]Phone: [phone]  Fax:  [phone] Name: BDanyelle BrookoverMRN: [id number] of Birth: 110-20-1947

## 2020-01-19 ENCOUNTER — Ambulatory Visit: Payer: Medicare Other

## 2020-01-19 ENCOUNTER — Other Ambulatory Visit: Payer: Self-pay

## 2020-01-19 NOTE — Therapy (Signed)
[facility] [address] , [place], [address] Phone: [phone]   Fax:  [phone]  Physical Therapy Treatment  Patient Details  Name: Tara Moody MRN: [id number] Date of Birth: 11/04/1945 Referring Provider (PT): David Patrick Fitzgerald, MD   Encounter Date: 01/19/2020    Past Medical History:  Diagnosis Date  . Hypertension     Past Surgical History:  Procedure Laterality Date  . CATARACT EXTRACTION    . REPLACEMENT TOTAL KNEE BILATERAL      There were no vitals filed for this visit.                             Pt arrived 25 to 30 minutes late. Pt rescheduled to tomorrow 01/20/20 at 1 pm     PT Short Term Goals - 12/14/19 1458      PT SHORT TERM GOAL #1   Title Pt will be independent with her HEP to improve balance and decrease fall risk.    Baseline Pt performing her HEP (12/14/2019)    Time 3    Period Weeks    Status Achieved    Target Date 12/17/19             PT Long Term Goals - 12/31/19 1505      PT LONG TERM GOAL #1   Title Patient will improve bilateral hip extension and abduction strength to improve steadiness with gait, and standing tasks, and decrease fall risk.    Time 8    Period Weeks    Status Achieved    Target Date 12/31/19      PT LONG TERM GOAL #2   Title Patient will improve her DGI balance score to 19/24 or more as a demonstration of improved balance.    Baseline 13/24 DGI (11/02/2019); 19/24 (12/07/2019)    Time 8    Period Weeks    Status Achieved    Target Date 12/31/19      PT LONG TERM GOAL #3   Title Patient will have a decrease in low back pain to 4/10 or less at worst to promote ability to peform standing tasks such as washing dishes or sweeping the floor more comfortably    Baseline 7/10 low back pain at most for the past 3 months (11/02/2019); 2/10 low back pain at most for the past 7 days (12/01/2019), (12/14/2019)     Time 8    Period Weeks    Status Achieved    Target Date 12/31/19      PT LONG TERM GOAL #4   Title Pt will be able to step over a shoe box and clear both feet independently at least 10x each LE to improve balance and decrease fall risk.    Baseline Unable to clear shoe box when stepping over it (11/02/2019).Able to clear shoe box about 65% of the time. (12/31/2019)    Time 5    Period Weeks    Status On-going    Target Date 02/04/20      PT LONG TERM GOAL #5   Title Pt will report decreased to no difficulty with stair negotiation with one UE assist (4 regular steps) to promote ability to get into and out of her house more easily.    Baseline Difficulty with stair negotiation secondary to fear of falling (11/02/2019); difficulty secondary to fear of falling. No difficulty wiht B UE assist. Difficulty when carrying something   without UE assist (12/01/2019)    Time 5    Period Weeks    Status Partially Met    Target Date 02/04/20                  Patient will benefit from skilled therapeutic intervention in order to improve the following deficits and impairments:     Visit Diagnosis: No diagnosis found.     Problem List Patient Active Problem List   Diagnosis Date Noted  . Influenza A 08/15/2016    Laygo,Miguel 01/19/2020, 1:31 PM  [facility] [address] [place], [place], [address] Phone: [phone]   Fax:  [phone]  Name: Iram Connelley MRN: [id number] Date of Birth: 08/25/1945   

## 2020-01-20 ENCOUNTER — Ambulatory Visit: Payer: Medicare Other

## 2020-01-20 DIAGNOSIS — R262 Difficulty in walking, not elsewhere classified: Secondary | ICD-10-CM

## 2020-01-20 DIAGNOSIS — Z9181 History of falling: Secondary | ICD-10-CM

## 2020-01-20 DIAGNOSIS — R2681 Unsteadiness on feet: Secondary | ICD-10-CM | POA: Diagnosis not present

## 2020-01-20 DIAGNOSIS — M6281 Muscle weakness (generalized): Secondary | ICD-10-CM

## 2020-01-20 NOTE — Therapy (Signed)
[facility] [address] [place], [address] Phone: [phone]   Fax:  [phone]  Physical Therapy Treatment  Patient Details  Name: Tara Moody MRN: [id number] Date of Birth: [birth year] Referring Provider (PT): Angelena Form, MD   Encounter Date: 01/20/2020   PT End of Session - 01/20/20 1311    Visit Number 18    Number of Visits 27    Date for PT Re-Evaluation 02/04/20    Authorization Type 8    Authorization Time Period of 10 progress report    PT Start Time 1311   pt arrived late   PT Stop Time 1340    PT Time Calculation (min) 29 min    Activity Tolerance Patient tolerated treatment well    Behavior During Therapy [facility] for tasks assessed/performed           Past Medical History:  Diagnosis Date  . Hypertension     Past Surgical History:  Procedure Laterality Date  . CATARACT EXTRACTION    . REPLACEMENT TOTAL KNEE BILATERAL      There were no vitals filed for this visit.   Subjective Assessment - 01/20/20 1312    Subjective No pain or discomfort anywhere. Walking is sometimes much better than others, thinks its improving.  Does not pull herself up the steps anymore.    Pertinent History Gait abnormality. Pt has had B knee, B shoulder replacements, B carpal tunnel surgeries. Pt hurt her L knee 2001. 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 Husband had a TBI and pt had take care of him until his death. Has had bouts of her L knee hurting. In the last 3 years, pt has been falling more. Fell 5 times in August 2020. Was outside walking the dogs and has had a hard time picking up her feet (has had that). Feels more tired since having COVID.  Has not fallen in the past 2 months because she has been paying attention. Fell up the steps which knocked the wind out of her in 09/2019.  Had COVID end of August/beginning September 2020. Wants to prevent falls. [address] one more time in March 2021 when her  dogs (jack russel and beagle; pt was walking them at the same time). Pt was on a road with little traffic but it has hills. Pt did not raise her R foot high enough and tripped on a step when pt fell in February 2021. Wonders if her R foot if shorter. When pt fell in August 2020, pt states that she tripped every time. Pt now able to catch herself when she trips. Kneels on her L knee to stand up from a fall. Pt reports tripping on R foot more than the L.  Pt has a house in [place]. Pt currently living in [place]. Planning on returning to [place] in September 2021. [place]: pt lives alone in a 1 story home. 3 steps front door with B rail, 4 steps back door, B rails. [place] home: 1 story home, 1 small step to enter, no rails (not a problem per pt). Same for back door.    Patient Stated Goals Be better able to perform floor to stand transfers.    Currently in Pain? No/denies    Pain Score 0-No pain    Pain Onset More than a month ago  PT Education - 01/20/20 1322    Education Details ther-ex    Person(s) Educated Patient    Methods Explanation;Demonstration;Tactile cues;Verbal cues    Comprehension Returned demonstration;Verbalized understanding           MedbridgeAccess Code [email or web address]  Gait: decreased stance L LE  R rail to go up 4 steps   Therapeutic exercise    SLS without UE assist to light touch assistas needed R10x5seconds for 2 sets L10x5seconds for 2 sets   improving ability to balance on R and L LE without UE assist observed   Static lunge with one UE assist to promote hip and knee extension strength for floor to stand transfers R 10x L 10x  Stand to 1/2 kneel to stand Air traffic controller Ex pad for knee) with B UE to one UE assist  R 5x  L 5x  CGA to min A   Stepping over 6 mini hurdles 8x alternating LE. Slight LOB at times, min A but improved overall  steadiness observed   Forward step up onto and over 6 inch step no UE assist, CGA             R 5x             L 5x   Improved exercise technique, movement at target joints, use of target muscles after mod verbal, visual, tactile cues.     Clinical impression/pt response:  Pt arrived late so session adjusted accordingly. Improving overall single leg balance observed with pt being able to maintain balance at times for about 5 seconds without UE assist observed. Improving steadiness with stepping over obstacles observed. Cues needed for foot clearance. Pt tolerated session well without aggravation of symptoms. Pt will benefit from continued skilled physical therapy services to improve strength, balance, and function.       PT Short Term Goals - 12/14/19 1458      PT SHORT TERM GOAL #1   Title Pt will be independent with her HEP to improve balance and decrease fall risk.    Baseline Pt performing her HEP (12/14/2019)    Time 3    Period Weeks    Status Achieved    Target Date 12/17/19             PT Long Term Goals - 12/31/19 1505      PT LONG TERM GOAL #1   Title Patient will improve bilateral hip extension and abduction strength to improve steadiness with gait, and standing tasks, and decrease fall risk.    Time 8    Period Weeks    Status Achieved    Target Date 12/31/19      PT LONG TERM GOAL #2   Title Patient will improve her DGI balance score to 19/24 or more as a demonstration of improved balance.    Baseline 13/24 DGI (11/02/2019); 19/24 (12/07/2019)    Time 8    Period Weeks    Status Achieved    Target Date 12/31/19      PT LONG TERM GOAL #3   Title Patient will have a decrease in low back pain to 4/10 or less at worst to promote ability to peform standing tasks such as washing dishes or sweeping the floor more comfortably    Baseline 7/10 low back pain at most for the past 3 months (11/02/2019); 2/10 low back pain at most for the past 7 days (12/01/2019),  (12/14/2019)    Time 8    Period Weeks  Status Achieved    Target Date 12/31/19      PT LONG TERM GOAL #4   Title Pt will be able to step over a shoe box and clear both feet independently at least 10x each LE to improve balance and decrease fall risk.    Baseline Unable to clear shoe box when stepping over it (11/02/2019).Able to clear shoe box about 65% of the time. (12/31/2019)    Time 5    Period Weeks    Status On-going    Target Date 02/04/20      PT LONG TERM GOAL #5   Title Pt will report decreased to no difficulty with stair negotiation with one UE assist (4 regular steps) to promote ability to get into and out of her house more easily.    Baseline Difficulty with stair negotiation secondary to fear of falling (11/02/2019); difficulty secondary to fear of falling. No difficulty wiht B UE assist. Difficulty when carrying something without UE assist (12/01/2019)    Time 5    Period Weeks    Status Partially Met    Target Date 02/04/20                 Plan - 01/20/20 1311    Clinical Impression Statement Pt arrived late so session adjusted accordingly. Improving overall single leg balance observed with pt being able to maintain balance at times for about 5 seconds without UE assist observed. Improving steadiness with stepping over obstacles observed. Cues needed for foot clearance. Pt tolerated session well without aggravation of symptoms. Pt will benefit from continued skilled physical therapy services to improve strength, balance, and function.    Personal Factors and Comorbidities Age;Comorbidity 3+;Fitness;Past/Current Experience;Time since onset of injury/illness/exacerbation    Comorbidities B total shoulder, B total knee replacements, low back pain with radiating symptoms, L knee pain, neck pain    Examination-Activity Limitations Lift;Stairs;Carry    Stability/Clinical Decision Making Stable/Uncomplicated    Rehab Potential Fair    PT Frequency 2x / week    PT  Duration --   5 weeks   PT Treatment/Interventions Therapeutic activities;Gait training;Stair training;Functional mobility training;Therapeutic exercise;Balance training;Neuromuscular re-education;Patient/family education;Manual techniques;Dry needling;Aquatic Therapy;Canalith Repostioning;Electrical Stimulation;Iontophoresis 64m/ml Dexamethasone;Vestibular    PT Next Visit Plan posture, scapular strengthening, thoracic extension, trunk, glute strengthening, femoral control, balance, manual techniques, modalities PRN    Consulted and Agree with Plan of Care Patient           Patient will benefit from skilled therapeutic intervention in order to improve the following deficits and impairments:  Pain, Postural dysfunction, Improper body mechanics, Difficulty walking, Decreased strength, Decreased balance, Abnormal gait  Visit Diagnosis: Unsteadiness on feet  Difficulty in walking, not elsewhere classified  Muscle weakness (generalized)  History of falling     Problem List Patient Active Problem List   Diagnosis Date Noted  . Influenza A 08/15/2016    MJoneen BoersPT, DPT   01/20/2020, 7:12 PM  [facility] [address] [place] [address]Phone: [phone]  Fax:  [phone] Name: BSumiko CeasarMRN: [id number] of Birth: [birth year]

## 2020-01-26 ENCOUNTER — Ambulatory Visit: Payer: Medicare Other

## 2020-01-26 ENCOUNTER — Other Ambulatory Visit: Payer: Self-pay

## 2020-01-26 DIAGNOSIS — R262 Difficulty in walking, not elsewhere classified: Secondary | ICD-10-CM

## 2020-01-26 DIAGNOSIS — R2681 Unsteadiness on feet: Secondary | ICD-10-CM

## 2020-01-26 DIAGNOSIS — M6281 Muscle weakness (generalized): Secondary | ICD-10-CM

## 2020-01-26 DIAGNOSIS — Z9181 History of falling: Secondary | ICD-10-CM

## 2020-01-26 NOTE — Therapy (Signed)
[place] [facility] [address], [place], [address] Phone: [phone]   Fax:  [phone]  Physical Therapy Treatment  Patient Details  Name: Tara Moody MRN: [id number] Date of Birth: 01/12/1946 Referring Provider (PT): Angelena Form, MD   Encounter Date: 01/26/2020   PT End of Session - 01/26/20 1118    Visit Number 19    Number of Visits 27    Date for PT Re-Evaluation 02/04/20    Authorization Type 9    Authorization Time Period of 10 progress report    PT Start Time 1117    PT Stop Time 1157    PT Time Calculation (min) 40 min    Activity Tolerance Patient tolerated treatment well    Behavior During Therapy [facility] for tasks assessed/performed           Past Medical History:  Diagnosis Date  . Hypertension     Past Surgical History:  Procedure Laterality Date  . CATARACT EXTRACTION    . REPLACEMENT TOTAL KNEE BILATERAL      There were no vitals filed for this visit.   Subjective Assessment - 01/26/20 1118    Subjective No pain or discomfort. Feels like balance is improving. Still has times when she does not feel sure footed, especially when tired.  Back and hips do not hurt like anything they used to.    Pertinent History Gait abnormality. Pt has had B knee, B shoulder replacements, B carpal tunnel surgeries. Pt hurt her L knee 2001. 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 Husband had a TBI and pt had take care of him until his death. Has had bouts of her L knee hurting. In the last 3 years, pt has been falling more. Fell 5 times in August 2020. Was outside walking the dogs and has had a hard time picking up her feet (has had that). Feels more tired since having COVID.  Has not fallen in the past 2 months because she has been paying attention. Fell up the steps which knocked the wind out of her in 09/2019.  Had COVID end of August/beginning September 2020. Wants to prevent falls. [address] one more time in  March 2021 when her dogs (jack russel and beagle; pt was walking them at the same time). Pt was on a road with little traffic but it has hills. Pt did not raise her R foot high enough and tripped on a step when pt fell in February 2021. Wonders if her R foot if shorter. When pt fell in August 2020, pt states that she tripped every time. Pt now able to catch herself when she trips. Kneels on her L knee to stand up from a fall. Pt reports tripping on R foot more than the L.  Pt has a house in [place]. Pt currently living in [place]. Planning on returning to [place] in September 2021. [place]: pt lives alone in a 1 story home. 3 steps front door with B rail, 4 steps back door, B rails. [place] home: 1 story home, 1 small step to enter, no rails (not a problem per pt). Same for back door.    Patient Stated Goals Be better able to perform floor to stand transfers.    Currently in Pain? No/denies    Pain Onset More than a month ago  PT Education - 01/26/20 1121    Education Details ther-ex    Person(s) Educated Patient    Methods Explanation;Demonstration;Tactile cues;Verbal cues    Comprehension Returned demonstration;Verbalized understanding           MedbridgeAccess Code [email or web address]  Gait: decreased stance L LE  R rail to go up 4 steps   Therapeutic exercise    SLS without UE assist to light touch assistas needed R10x10seconds L10x10seconds              improving ability to balance on R and L LE without UE assist observed  Stepping over 6 mini hurdles with stepping over shoe box and stepping onto and over Air ex pad in the middle 6x   Static lunge with one UE assist to promote hip and knee extension strength for floor to stand transfers R 10x L 10x  Forward step up onto 1st regular step without UE assist   R 10x2  L 10x2   Light touch assist at times  when performing with L LE.   Stepping over shoe box, emphasis on foot clearance.   R 10x   Difficulty placing her center of gravity onto front foot base of support at times, difficulty with L foot clearance at times   L 10x   More difficulty with L LE compared to R.   Improved exercise technique, movement at target joints, use of target muscles after mod verbal, visual, tactile cues.     Clinical impression/pt response:  Pt continues to demonstrate difficulty stepping over obstacles, especially wider objects L LE > R LE in which glute med weakness as well as difficulty weight shifting to place her center of gravity onto her base of support may play a factor. Continued working on [employer], max, and quad strengthening as well as obstacle negotiation to help address. Pt tolerated session well without aggravation of symptoms. Pt will benefit from continued skilled physical therapy services to improve strength, balance, and function.      PT Short Term Goals - 12/14/19 1458      PT SHORT TERM GOAL #1   Title Pt will be independent with her HEP to improve balance and decrease fall risk.    Baseline Pt performing her HEP (12/14/2019)    Time 3    Period Weeks    Status Achieved    Target Date 12/17/19             PT Long Term Goals - 12/31/19 1505      PT LONG TERM GOAL #1   Title Patient will improve bilateral hip extension and abduction strength to improve steadiness with gait, and standing tasks, and decrease fall risk.    Time 8    Period Weeks    Status Achieved    Target Date 12/31/19      PT LONG TERM GOAL #2   Title Patient will improve her DGI balance score to 19/24 or more as a demonstration of improved balance.    Baseline 13/24 DGI (11/02/2019); 19/24 (12/07/2019)    Time 8    Period Weeks    Status Achieved    Target Date 12/31/19      PT LONG TERM GOAL #3   Title Patient will have a decrease in low back pain to 4/10 or less at worst to promote ability to  peform standing tasks such as washing dishes or sweeping the floor more comfortably    Baseline 7/10 low back pain at most for  the past 3 months (11/02/2019); 2/10 low back pain at most for the past 7 days (12/01/2019), (12/14/2019)    Time 8    Period Weeks    Status Achieved    Target Date 12/31/19      PT LONG TERM GOAL #4   Title Pt will be able to step over a shoe box and clear both feet independently at least 10x each LE to improve balance and decrease fall risk.    Baseline Unable to clear shoe box when stepping over it (11/02/2019).Able to clear shoe box about 65% of the time. (12/31/2019)    Time 5    Period Weeks    Status On-going    Target Date 02/04/20      PT LONG TERM GOAL #5   Title Pt will report decreased to no difficulty with stair negotiation with one UE assist (4 regular steps) to promote ability to get into and out of her house more easily.    Baseline Difficulty with stair negotiation secondary to fear of falling (11/02/2019); difficulty secondary to fear of falling. No difficulty wiht B UE assist. Difficulty when carrying something without UE assist (12/01/2019)    Time 5    Period Weeks    Status Partially Met    Target Date 02/04/20                 Plan - 01/26/20 1122    Clinical Impression Statement Pt continues to demonstrate difficulty stepping over obstacles, especially wider objects L LE > R LE in which glute med weakness as well as difficulty weight shifting to place her center of gravity onto her base of support may play a factor. Continued working on [employer], max, and quad strengthening as well as obstacle negotiation to help address. Pt tolerated session well without aggravation of symptoms. Pt will benefit from continued skilled physical therapy services to improve strength, balance, and function.    Personal Factors and Comorbidities Age;Comorbidity 3+;Fitness;Past/Current Experience;Time since onset of injury/illness/exacerbation    Comorbidities B  total shoulder, B total knee replacements, low back pain with radiating symptoms, L knee pain, neck pain    Examination-Activity Limitations Lift;Stairs;Carry    Stability/Clinical Decision Making Stable/Uncomplicated    Rehab Potential Fair    PT Frequency 2x / week    PT Duration --   5 weeks   PT Treatment/Interventions Therapeutic activities;Gait training;Stair training;Functional mobility training;Therapeutic exercise;Balance training;Neuromuscular re-education;Patient/family education;Manual techniques;Dry needling;Aquatic Therapy;Canalith Repostioning;Electrical Stimulation;Iontophoresis 78m/ml Dexamethasone;Vestibular    PT Next Visit Plan posture, scapular strengthening, thoracic extension, trunk, glute strengthening, femoral control, balance, manual techniques, modalities PRN    Consulted and Agree with Plan of Care Patient           Patient will benefit from skilled therapeutic intervention in order to improve the following deficits and impairments:  Pain, Postural dysfunction, Improper body mechanics, Difficulty walking, Decreased strength, Decreased balance, Abnormal gait  Visit Diagnosis: Unsteadiness on feet  Difficulty in walking, not elsewhere classified  Muscle weakness (generalized)  History of falling     Problem List Patient Active Problem List   Diagnosis Date Noted  . Influenza A 08/15/2016    MJoneen BoersPT, DPT   01/26/2020, 12:16 PM  [facility] [facility] [address] [place] [address]Phone: [phone]  Fax:  [phone] Name: BCathyann KilfoyleMRN: [id number] of Birth: [birth year]

## 2020-01-28 ENCOUNTER — Ambulatory Visit: Payer: Medicare Other

## 2020-01-28 ENCOUNTER — Other Ambulatory Visit: Payer: Self-pay

## 2020-01-28 DIAGNOSIS — R2681 Unsteadiness on feet: Secondary | ICD-10-CM

## 2020-01-28 DIAGNOSIS — R262 Difficulty in walking, not elsewhere classified: Secondary | ICD-10-CM

## 2020-01-28 DIAGNOSIS — Z9181 History of falling: Secondary | ICD-10-CM

## 2020-01-28 DIAGNOSIS — M6281 Muscle weakness (generalized): Secondary | ICD-10-CM

## 2020-01-28 NOTE — Therapy (Signed)
[facility] [address], [place], [address] Phone: [phone]   Fax:  [phone]  Physical Therapy Treatment And Progress Report (12/17/2019 - 01/28/2020)  Patient Details  Name: Tara Moody MRN: [id number] Date of Birth: [birth year] Referring Provider (PT): Dierdre Harness, MD   Encounter Date: 01/28/2020   PT End of Session - 01/28/20 1548    Visit Number 20    Number of Visits 27    Date for PT Re-Evaluation 02/04/20    Authorization Type 1    Authorization Time Period of 10 progress report    PT Start Time 1548    PT Stop Time 1636    PT Time Calculation (min) 48 min    Activity Tolerance Patient tolerated treatment well    Behavior During Therapy [facility] for tasks assessed/performed           Past Medical History:  Diagnosis Date  . Hypertension     Past Surgical History:  Procedure Laterality Date  . CATARACT EXTRACTION    . REPLACEMENT TOTAL KNEE BILATERAL      There were no vitals filed for this visit.   Subjective Assessment - 01/28/20 1550    Subjective No pain or discomfort. Doing good. Pt states that she feels more steady now when she walks. Likes going to PT.    Pertinent History Gait abnormality. Pt has had B knee, B shoulder replacements, B carpal tunnel surgeries. Pt hurt her L knee 2001. 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 Husband had a TBI and pt had take care of him until his death. Has had bouts of her L knee hurting. In the last 3 years, pt has been falling more. Fell 5 times in August 2020. Was outside walking the dogs and has had a hard time picking up her feet (has had that). Feels more tired since having COVID.  Has not fallen in the past 2 months because she has been paying attention. Fell up the steps which knocked the wind out of her in 09/2019.  Had COVID end of August/beginning September 2020. Wants to prevent falls. Larey Seat one more time in March 2021 when her dogs  (jack russel and beagle; pt was walking them at the same time). Pt was on a road with little traffic but it has hills. Pt did not raise her R foot high enough and tripped on a step when pt fell in February 2021. Wonders if her R foot if shorter. When pt fell in August 2020, pt states that she tripped every time. Pt now able to catch herself when she trips. Kneels on her L knee to stand up from a fall. Pt reports tripping on R foot more than the L.  Pt has a house in [place]. Pt currently living in [place]. Planning on returning to [place] in September 2021. [place]: pt lives alone in a 1 story home. 3 steps front door with B rail, 4 steps back door, B rails. [place] home: 1 story home, 1 small step to enter, no rails (not a problem per pt). Same for back door.    Patient Stated Goals Be better able to perform floor to stand transfers.    Currently in Pain? No/denies    Pain Score 0-No pain    Pain Onset More than a month ago  PT Education - 01/28/20 1557    Education Details ther-ex    Person(s) Educated Patient    Methods Explanation;Demonstration;Tactile cues;Verbal cues    Comprehension Returned demonstration;Verbalized understanding              MedbridgeAccess Code [email or web address]  Gait: decreased stance L LE  R rail to go up 4 steps   Therapeutic exercise    SLS without UE assist to light touch assistas needed R10x10seconds L10x10seconds  improving ability to balance on R and L LE without UE assist observed  Ascending and descending 4 regular steps without UE assist 2x. Pt tendency for backward lean  SBA to CGA to min A    Then with light touch assist R rail 10x, cues for forward weight shifting to better place her center of gravity over base of support. More steady after cues.     Stepping over shoe box, emphasis on foot clearance.              R 10x2                          Difficulty placing her center of gravity onto front foot base of support at times, as pt performs more repetitions, difficulty with L foot clearance at times              L 10x2 Improved weight shifting onto base of support after cues  Reviewed progress/current status with PT towards goals    SLS with contralateral foot on bosu with PT manual perturbation  R 1 min  L 1 min  Forward and back weight shifting semi tandem stance   R foot in front 10x  L foot in front 10x   Improved exercise technique, movement at target joints, use of target muscles after mod verbal, visual, tactile cues.     Clinical impression/pt response:  Pt overall demonstrates improved hip strength ability to negotiation stairs with less difficulty, an ability to step over obstacles. Still demonstrates difficulty with consistency of stepping over obstacles and clearing them secondary to glute med weakness, and difficulty consistently transferring her center of gravity onto the front foot (base of support) completely. Continued working on standing glute strength and stability and weight shifting to help address. Pt tolerated session well without aggravation of symptoms. Pt will benefit from continued skilled physical therapy services to improve strength, balance, and function.        PT Short Term Goals - 12/14/19 1458      PT SHORT TERM GOAL #1   Title Pt will be independent with her HEP to improve balance and decrease fall risk.    Baseline Pt performing her HEP (12/14/2019)    Time 3    Period Weeks    Status Achieved    Target Date 12/17/19             PT Long Term Goals - 01/28/20 1619      PT LONG TERM GOAL #1   Title Patient will improve bilateral hip extension and abduction strength to improve steadiness with gait, and standing tasks, and decrease fall risk.    Time 8    Period Weeks    Status Achieved      PT LONG TERM GOAL #2   Title Patient will improve her DGI  balance score to 19/24 or more as a demonstration of improved balance.    Baseline 13/24 DGI (11/02/2019); 19/24 (12/07/2019)    Time 8  Period Weeks    Status Achieved      PT LONG TERM GOAL #3   Title Patient will have a decrease in low back pain to 4/10 or less at worst to promote ability to peform standing tasks such as washing dishes or sweeping the floor more comfortably    Baseline 7/10 low back pain at most for the past 3 months (11/02/2019); 2/10 low back pain at most for the past 7 days (12/01/2019), (12/14/2019)    Time 8    Period Weeks    Status Achieved      PT LONG TERM GOAL #4   Title Pt will be able to step over a shoe box and clear both feet independently at least 10x each LE to improve balance and decrease fall risk.    Baseline Unable to clear shoe box when stepping over it (11/02/2019).Able to clear shoe box about 65% of the time. (12/31/2019), (01/28/2020)    Time 5    Period Weeks    Status On-going    Target Date 02/04/20      PT LONG TERM GOAL #5   Title Pt will report decreased to no difficulty with stair negotiation with one UE assist (4 regular steps) to promote ability to get into and out of her house more easily.    Baseline Difficulty with stair negotiation secondary to fear of falling (11/02/2019); difficulty secondary to fear of falling. No difficulty wiht B UE assist. Difficulty when carrying something without UE assist (12/01/2019); Difficulty when performing without UE assist. Able to perform with R rail assist (01/28/2020)    Time 5    Period Weeks    Status Achieved    Target Date 02/04/20      PT LONG TERM GOAL #6   Title Pt will report decreased to no difficulty with stair negotiation WITHOUT UE assist (4 regular steps) to promote ability to get into and out of her house more easily.    Time 2    Period Weeks    Status New    Target Date 02/04/20                 Plan - 01/28/20 1649    Clinical Impression Statement Pt overall demonstrates  improved hip strength ability to negotiation stairs with less difficulty, an ability to step over obstacles. Still demonstrates difficulty with consistency of stepping over obstacles and clearing them secondary to glute med weakness, and difficulty consistently transferring her center of gravity onto the front foot (base of support) completely. Continued working on standing glute strength and stability and weight shifting to help address. Pt tolerated session well without aggravation of symptoms. Pt will benefit from continued skilled physical therapy services to improve strength, balance, and function.    Personal Factors and Comorbidities Age;Comorbidity 3+;Fitness;Past/Current Experience;Time since onset of injury/illness/exacerbation    Comorbidities B total shoulder, B total knee replacements, low back pain with radiating symptoms, L knee pain, neck pain    Examination-Activity Limitations Lift;Stairs;Carry    Stability/Clinical Decision Making Stable/Uncomplicated    Rehab Potential Fair    PT Frequency 2x / week    PT Duration --   5 weeks   PT Treatment/Interventions Therapeutic activities;Gait training;Stair training;Functional mobility training;Therapeutic exercise;Balance training;Neuromuscular re-education;Patient/family education;Manual techniques;Dry needling;Aquatic Therapy;Canalith Repostioning;Electrical Stimulation;Iontophoresis 4mg /ml Dexamethasone;Vestibular    PT Next Visit Plan posture, scapular strengthening, thoracic extension, trunk, glute strengthening, femoral control, balance, manual techniques, modalities PRN    Consulted and Agree with Plan of Care Patient  Patient will benefit from skilled therapeutic intervention in order to improve the following deficits and impairments:  Pain, Postural dysfunction, Improper body mechanics, Difficulty walking, Decreased strength, Decreased balance, Abnormal gait  Visit Diagnosis: Unsteadiness on feet  Difficulty in  walking, not elsewhere classified  Muscle weakness (generalized)  History of falling     Problem List Patient Active Problem List   Diagnosis Date Noted  . Influenza A 08/15/2016    Thank you for your referral.  Joneen Boers PT, DPT   01/28/2020, 5:00 PM  [place] [facility] [address], [place], [address] Phone: [phone]   Fax:  [phone]  Name: Tara Moody MRN: [id number] Date of Birth: [birth year]

## 2020-02-01 ENCOUNTER — Ambulatory Visit: Payer: Medicare Other

## 2020-02-01 ENCOUNTER — Other Ambulatory Visit: Payer: Self-pay

## 2020-02-01 DIAGNOSIS — M6281 Muscle weakness (generalized): Secondary | ICD-10-CM

## 2020-02-01 DIAGNOSIS — Z9181 History of falling: Secondary | ICD-10-CM

## 2020-02-01 DIAGNOSIS — R2681 Unsteadiness on feet: Secondary | ICD-10-CM | POA: Diagnosis not present

## 2020-02-01 DIAGNOSIS — R262 Difficulty in walking, not elsewhere classified: Secondary | ICD-10-CM

## 2020-02-01 NOTE — Therapy (Signed)
[facility] [address] [place], [address] Phone: [phone]   Fax:  [phone]  Physical Therapy Treatment  Patient Details  Name: Tara Moody MRN: [id number] Date of Birth: Aug 15, 1945 Referring Provider (PT): Dierdre Harness, MD   Encounter Date: 02/01/2020   PT End of Session - 02/01/20 1612    Visit Number 21    Number of Visits 39    Date for PT Re-Evaluation 03/17/20    Authorization Type 1    Authorization Time Period of 10 progress report    PT Start Time 1612   pt arrived late   PT Stop Time 1645    PT Time Calculation (min) 33 min    Activity Tolerance Patient tolerated treatment well    Behavior During Therapy [facility] for tasks assessed/performed           Past Medical History:  Diagnosis Date   Hypertension     Past Surgical History:  Procedure Laterality Date   CATARACT EXTRACTION     REPLACEMENT TOTAL KNEE BILATERAL      There were no vitals filed for this visit.   Subjective Assessment - 02/01/20 1613    Subjective Doing good. No pain or discomfort.    Pertinent History Gait abnormality. Pt has had B knee, B shoulder replacements, B carpal tunnel surgeries. Pt hurt her L knee 2001. 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 Husband had a TBI and pt had take care of him until his death. Has had bouts of her L knee hurting. In the last 3 years, pt has been falling more. Fell 5 times in August 2020. Was outside walking the dogs and has had a hard time picking up her feet (has had that). Feels more tired since having COVID.  Has not fallen in the past 2 months because she has been paying attention. Fell up the steps which knocked the wind out of her in 09/2019.  Had COVID end of August/beginning September 2020. Wants to prevent falls. Larey Seat one more time in March 2021 when her dogs (jack russel and beagle; pt was walking them at the same time). Pt was on a road with little traffic but it  has hills. Pt did not raise her R foot high enough and tripped on a step when pt fell in February 2021. Wonders if her R foot if shorter. When pt fell in August 2020, pt states that she tripped every time. Pt now able to catch herself when she trips. Kneels on her L knee to stand up from a fall. Pt reports tripping on R foot more than the L.  Pt has a house in [place]. Pt currently living in [place]. Planning on returning to [place] in September 2021. [place]: pt lives alone in a 1 story home. 3 steps front door with B rail, 4 steps back door, B rails. [place] home: 1 story home, 1 small step to enter, no rails (not a problem per pt). Same for back door.    Patient Stated Goals Be better able to perform floor to stand transfers.    Currently in Pain? No/denies    Pain Onset More than a month ago                                     PT Education - 02/01/20 1830    Education Details ther-ex,  plan of care    Person(s) Educated Patient    Methods Explanation;Demonstration;Tactile cues;Verbal cues    Comprehension Returned demonstration;Verbalized understanding          MedbridgeAccess Code [email or web address]  Gait: decreased stance L LE  R rail to go up 4 steps   Therapeutic exercise    SLS without UE assist to light touch assistas needed R10x10seconds L10x10seconds   Forward and back weight shifting semi tandem stance      R foot in front 10x2     L foot in front 10x2  Stepping over shoe box, emphasis on foot clearance.  R 10x, then 6x  L 10x, then 6x   L posterior hip tightness when trying to step over with L LE which made hip flexion difficult for her   Improved weight shifting onto base of support after cues   Seated manually resisted L lateral shift isometrics to counter R lateral shift posture 10x5 seconds for 2 sets  Then stepping over shoe box again, 5x each LE.  Less L posterior hip tightness.   Reviewed plan of care: continued for another 2x/week for 6 weeks.    Improved exercise technique, movement at target joints, use of target muscles after mod verbal, visual, tactile cues.     Clinical impression/pt response: Pt arrived late and session was adjusted accordingly. Continued working on glute strengthening, single leg balance and weight shifting to promote ability to negotiate obstacles with less difficulty. Difficulty forward weight shifting onto L foot compared to R LE. Pt overall demonstrates improved hip strength ability to negotiate stairs with less difficulty, and ability to step over obstacles. Still demonstrates difficulty with consistency of clearing obstacles as well as negotiating stairs safely without UE assist and would benefit from continued skilled physical therapy services to address the aforementioned deficits to improve balance and decrease fall risk.      PT Short Term Goals - 12/14/19 1458      PT SHORT TERM GOAL #1   Title Pt will be independent with her HEP to improve balance and decrease fall risk.    Baseline Pt performing her HEP (12/14/2019)    Time 3    Period Weeks    Status Achieved    Target Date 12/17/19             PT Long Term Goals - 02/01/20 1833      PT LONG TERM GOAL #1   Title Patient will improve bilateral hip extension and abduction strength to improve steadiness with gait, and standing tasks, and decrease fall risk.    Time 8    Period Weeks    Status Achieved      PT LONG TERM GOAL #2   Title Patient will improve her DGI balance score to 19/24 or more as a demonstration of improved balance.    Baseline 13/24 DGI (11/02/2019); 19/24 (12/07/2019)    Time 8    Period Weeks    Status Achieved      PT LONG TERM GOAL #3   Title Patient will have a decrease in low back pain to 4/10 or less at worst to promote ability to peform standing tasks such as washing dishes or sweeping the floor more  comfortably    Baseline 7/10 low back pain at most for the past 3 months (11/02/2019); 2/10 low back pain at most for the past 7 days (12/01/2019), (12/14/2019)    Time 8    Period Weeks    Status Achieved  PT LONG TERM GOAL #4   Title Pt will be able to step over a shoe box and clear both feet independently at least 10x each LE to improve balance and decrease fall risk.    Baseline Unable to clear shoe box when stepping over it (11/02/2019).Able to clear shoe box about 65% of the time. (12/31/2019), (01/28/2020)    Time 6    Period Weeks    Status On-going    Target Date 03/17/20      PT LONG TERM GOAL #5   Title Pt will report decreased to no difficulty with stair negotiation with one UE assist (4 regular steps) to promote ability to get into and out of her house more easily.    Baseline Difficulty with stair negotiation secondary to fear of falling (11/02/2019); difficulty secondary to fear of falling. No difficulty wiht B UE assist. Difficulty when carrying something without UE assist (12/01/2019); Difficulty when performing without UE assist. Able to perform with R rail assist (01/28/2020)    Time 5    Period Weeks    Status Achieved      PT LONG TERM GOAL #6   Title Pt will report decreased to no difficulty with stair negotiation WITHOUT UE assist (4 regular steps) to promote ability to get into and out of her house more easily.    Time 6    Period Weeks    Status On-going    Target Date 03/17/20                 Plan - 02/01/20 1832    Clinical Impression Statement Pt arrived late and session was adjusted accordingly. Continued working on glute strengthening, single leg balance and weight shifting to promote ability to negotiate obstacles with less difficulty. Difficulty forward weight shifting onto L foot compared to R LE. Pt overall demonstrates improved hip strength ability to negotiate stairs with less difficulty, and ability to step over obstacles. Still demonstrates  difficulty with consistency of clearing obstacles as well as negotiating stairs safely without UE assist and would benefit from continued skilled physical therapy services to address the aforementioned deficits to improve balance and decrease fall risk.    Personal Factors and Comorbidities Age;Comorbidity 3+;Fitness;Past/Current Experience;Time since onset of injury/illness/exacerbation    Comorbidities B total shoulder, B total knee replacements, low back pain with radiating symptoms, L knee pain, neck pain    Examination-Activity Limitations Lift;Stairs;Carry    Stability/Clinical Decision Making Stable/Uncomplicated    Clinical Decision Making Low    Rehab Potential Fair    PT Frequency 2x / week    PT Duration 6 weeks    PT Treatment/Interventions Therapeutic activities;Gait training;Stair training;Functional mobility training;Therapeutic exercise;Balance training;Neuromuscular re-education;Patient/family education;Manual techniques;Dry needling;Aquatic Therapy;Canalith Repostioning;Electrical Stimulation;Iontophoresis 4mg /ml Dexamethasone;Vestibular    PT Next Visit Plan posture, scapular strengthening, thoracic extension, trunk, glute strengthening, femoral control, balance, manual techniques, modalities PRN    PT Home Exercise Plan Medbridge Access Code [email or web address]    Consulted and Agree with Plan of Care Patient           Patient will benefit from skilled therapeutic intervention in order to improve the following deficits and impairments:  Pain, Postural dysfunction, Improper body mechanics, Difficulty walking, Decreased strength, Decreased balance, Abnormal gait  Visit Diagnosis: Unsteadiness on feet - Plan: PT plan of care cert/re-cert  Difficulty in walking, not elsewhere classified - Plan: PT plan of care cert/re-cert  Muscle weakness (generalized) - Plan: PT plan of care cert/re-cert  History of falling -  Plan: PT plan of care cert/re-cert     Problem List Patient Active  Problem List   Diagnosis Date Noted   Influenza A 08/15/2016    Loralyn Freshwater PT, DPT   02/01/2020, 6:44 PM  [facility] [address] [place], [address] Phone: [phone]   Fax:  [phone]  Name: Elesha Thedford MRN: [id number] Date of Birth: [birth year]

## 2020-02-03 ENCOUNTER — Other Ambulatory Visit: Payer: Self-pay

## 2020-02-03 ENCOUNTER — Ambulatory Visit: Payer: Medicare Other

## 2020-02-03 DIAGNOSIS — Z9181 History of falling: Secondary | ICD-10-CM

## 2020-02-03 DIAGNOSIS — R2681 Unsteadiness on feet: Secondary | ICD-10-CM | POA: Diagnosis not present

## 2020-02-03 DIAGNOSIS — R262 Difficulty in walking, not elsewhere classified: Secondary | ICD-10-CM

## 2020-02-03 DIAGNOSIS — M6281 Muscle weakness (generalized): Secondary | ICD-10-CM

## 2020-02-03 NOTE — Therapy (Signed)
[facility] [address] [place], [address] Phone: [phone]   Fax:  [phone]  Physical Therapy Treatment  Patient Details  Name: Tara Moody MRN: [id number] Date of Birth: [birth year] Referring Provider (PT): Dierdre Harness, MD   Encounter Date: 02/03/2020   PT End of Session - 02/03/20 1358    Visit Number 22    Number of Visits 39    Date for PT Re-Evaluation 03/17/20    Authorization Type 2    Authorization Time Period of 10 progress report    PT Start Time 1358   pt arrived late   PT Stop Time 1423    PT Time Calculation (min) 25 min    Activity Tolerance Patient tolerated treatment well    Behavior During Therapy [facility] for tasks assessed/performed           Past Medical History:  Diagnosis Date   Hypertension     Past Surgical History:  Procedure Laterality Date   CATARACT EXTRACTION     REPLACEMENT TOTAL KNEE BILATERAL      There were no vitals filed for this visit.   Subjective Assessment - 02/03/20 1359    Subjective No pain or discomfort anywhere. Walking feels better. A little sore L hip since last night.    Pertinent History Gait abnormality. Pt has had B knee, B shoulder replacements, B carpal tunnel surgeries. Pt hurt her L knee 2001. 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 Husband had a TBI and pt had take care of him until his death. Has had bouts of her L knee hurting. In the last 3 years, pt has been falling more. Fell 5 times in August 2020. Was outside walking the dogs and has had a hard time picking up her feet (has had that). Feels more tired since having COVID.  Has not fallen in the past 2 months because she has been paying attention. Fell up the steps which knocked the wind out of her in 09/2019.  Had COVID end of August/beginning September 2020. Wants to prevent falls. Larey Seat one more time in March 2021 when her dogs (jack russel and beagle; pt was walking them at the  same time). Pt was on a road with little traffic but it has hills. Pt did not raise her R foot high enough and tripped on a step when pt fell in February 2021. Wonders if her R foot if shorter. When pt fell in August 2020, pt states that she tripped every time. Pt now able to catch herself when she trips. Kneels on her L knee to stand up from a fall. Pt reports tripping on R foot more than the L.  Pt has a house in [place]. Pt currently living in [place]. Planning on returning to [place] in September 2021. [place]: pt lives alone in a 1 story home. 3 steps front door with B rail, 4 steps back door, B rails. [place] home: 1 story home, 1 small step to enter, no rails (not a problem per pt). Same for back door.    Patient Stated Goals Be better able to perform floor to stand transfers.    Currently in Pain? No/denies    Pain Onset More than a month ago                                     PT  Education - 02/03/20 1408    Education Details ther-ex    Person(s) Educated Patient    Methods Explanation;Demonstration;Tactile cues;Verbal cues    Comprehension Returned demonstration;Verbalized understanding          MedbridgeAccess Code [email or web address]  Gait: decreased stance L LE  R rail to go up 4 steps   Therapeutic exercise  Static lunge with one UE assist to promote glute max and quad strengthening for gait balance and floor to stand transfers.   R 10x  L 10x  Forward and back weight shifting semi tandem stance  R foot in front 10x2 L foot in front 10x2  improving control observed   Forward step up onto and over 4 inch step, no UE assist, CGA   R 10x  L 10x  Forward step over 2 yoga bricks adjacent to each other on side  R 10x2  L 10x2   Improved ability to step over obstacle with L LE and clear it with both feet.       Improved exercise technique, movement at target joints, use of target muscles after mod verbal, visual, tactile cues.       Clinical impression/pt response: Pt arrived late and session was adjusted accordingly. Improving ability to forward weight shift and translate her body weight onto the foot in front to promote ability for trailing foot to clear obstacle. Continued working on glute max and quad strengthening to promote gait balance as well. Pt tolerated session well without aggravation of symptoms. Pt will benefit from continued skilled physical therapy services to improve strength, balance and function.        PT Short Term Goals - 12/14/19 1458      PT SHORT TERM GOAL #1   Title Pt will be independent with her HEP to improve balance and decrease fall risk.    Baseline Pt performing her HEP (12/14/2019)    Time 3    Period Weeks    Status Achieved    Target Date 12/17/19             PT Long Term Goals - 02/01/20 1833      PT LONG TERM GOAL #1   Title Patient will improve bilateral hip extension and abduction strength to improve steadiness with gait, and standing tasks, and decrease fall risk.    Time 8    Period Weeks    Status Achieved      PT LONG TERM GOAL #2   Title Patient will improve her DGI balance score to 19/24 or more as a demonstration of improved balance.    Baseline 13/24 DGI (11/02/2019); 19/24 (12/07/2019)    Time 8    Period Weeks    Status Achieved      PT LONG TERM GOAL #3   Title Patient will have a decrease in low back pain to 4/10 or less at worst to promote ability to peform standing tasks such as washing dishes or sweeping the floor more comfortably    Baseline 7/10 low back pain at most for the past 3 months (11/02/2019); 2/10 low back pain at most for the past 7 days (12/01/2019), (12/14/2019)    Time 8    Period Weeks    Status Achieved      PT LONG TERM GOAL #4   Title Pt will be able to step over a shoe box and clear both feet independently at least 10x each LE to improve balance and decrease fall risk.    Baseline Unable  to clear shoe box when stepping  over it (11/02/2019).Able to clear shoe box about 65% of the time. (12/31/2019), (01/28/2020)    Time 6    Period Weeks    Status On-going    Target Date 03/17/20      PT LONG TERM GOAL #5   Title Pt will report decreased to no difficulty with stair negotiation with one UE assist (4 regular steps) to promote ability to get into and out of her house more easily.    Baseline Difficulty with stair negotiation secondary to fear of falling (11/02/2019); difficulty secondary to fear of falling. No difficulty wiht B UE assist. Difficulty when carrying something without UE assist (12/01/2019); Difficulty when performing without UE assist. Able to perform with R rail assist (01/28/2020)    Time 5    Period Weeks    Status Achieved      PT LONG TERM GOAL #6   Title Pt will report decreased to no difficulty with stair negotiation WITHOUT UE assist (4 regular steps) to promote ability to get into and out of her house more easily.    Time 6    Period Weeks    Status On-going    Target Date 03/17/20                 Plan - 02/03/20 1408    Clinical Impression Statement Pt arrived late and session was adjusted accordingly. Improving ability to forward weight shift and translate her body weight onto the foot in front to promote ability for trailing foot to clear obstacle. Continued working on glute max and quad strengthening to promote gait balance as well. Pt tolerated session well without aggravation of symptoms. Pt will benefit from continued skilled physical therapy services to improve strength, balance and function.    Personal Factors and Comorbidities Age;Comorbidity 3+;Fitness;Past/Current Experience;Time since onset of injury/illness/exacerbation    Comorbidities B total shoulder, B total knee replacements, low back pain with radiating symptoms, L knee pain, neck pain    Examination-Activity Limitations Lift;Stairs;Carry    Stability/Clinical Decision Making Stable/Uncomplicated    Rehab  Potential Fair    PT Frequency 2x / week    PT Duration 6 weeks    PT Treatment/Interventions Therapeutic activities;Gait training;Stair training;Functional mobility training;Therapeutic exercise;Balance training;Neuromuscular re-education;Patient/family education;Manual techniques;Dry needling;Aquatic Therapy;Canalith Repostioning;Electrical Stimulation;Iontophoresis 4mg /ml Dexamethasone;Vestibular    PT Next Visit Plan posture, scapular strengthening, thoracic extension, trunk, glute strengthening, femoral control, balance, manual techniques, modalities PRN    PT Home Exercise Plan Medbridge Access Code    Consulted and Agree with Plan of Care Patient           Patient will benefit from skilled therapeutic intervention in order to improve the following deficits and impairments:  Pain, Postural dysfunction, Improper body mechanics, Difficulty walking, Decreased strength, Decreased balance, Abnormal gait  Visit Diagnosis: Unsteadiness on feet  Difficulty in walking, not elsewhere classified  Muscle weakness (generalized)  History of falling     Problem List Patient Active Problem List   Diagnosis Date Noted   Influenza A 08/15/2016   10/13/2016 PT, DPT  02/03/2020, 4:29 PM  [facility] [address], [address], [place] Phone: [phone]   Fax:  [phone]  Name: Tara Moody MRN: Elmon Else Date of Birth: [birth year]

## 2020-02-10 ENCOUNTER — Ambulatory Visit: Payer: Medicare Other

## 2020-02-11 ENCOUNTER — Other Ambulatory Visit: Payer: Self-pay

## 2020-02-11 ENCOUNTER — Ambulatory Visit: Payer: Medicare Other | Attending: Infectious Diseases

## 2020-02-11 DIAGNOSIS — R2681 Unsteadiness on feet: Secondary | ICD-10-CM | POA: Insufficient documentation

## 2020-02-11 DIAGNOSIS — Z9181 History of falling: Secondary | ICD-10-CM | POA: Diagnosis present

## 2020-02-11 DIAGNOSIS — R262 Difficulty in walking, not elsewhere classified: Secondary | ICD-10-CM | POA: Diagnosis present

## 2020-02-11 DIAGNOSIS — M6281 Muscle weakness (generalized): Secondary | ICD-10-CM | POA: Diagnosis present

## 2020-02-11 NOTE — Therapy (Signed)
[facility] [address], [place], [address] Phone: [phone]   Fax:  [phone]  Physical Therapy Treatment  Patient Details  Name: Tara Moody MRN: [id number] Date of Birth: 1946-04-08 Referring Provider (PT): Dierdre Harness, MD   Encounter Date: 02/11/2020   PT End of Session - 02/11/20 1550    Visit Number 23    Number of Visits 39    Date for PT Re-Evaluation 03/17/20    Authorization Type 3    Authorization Time Period of 10 progress report    PT Start Time 1500    PT Stop Time 1545    PT Time Calculation (min) 45 min    Equipment Utilized During Treatment Gait belt    Activity Tolerance Patient tolerated treatment well    Behavior During Therapy WFL for tasks assessed/performed           Past Medical History:  Diagnosis Date  . Hypertension     Past Surgical History:  Procedure Laterality Date  . CATARACT EXTRACTION    . REPLACEMENT TOTAL KNEE BILATERAL      There were no vitals filed for this visit.   Subjective Assessment - 02/11/20 1508    Subjective Patient reported that she is feeling well today. no pain. She stated she fell last Wednesday, she was walking her dogs. She was to get up pretty easily, stated she didn't hurt anything.    Pertinent History Gait abnormality. Pt has had B knee, B shoulder replacements, B carpal tunnel surgeries. Pt hurt her L knee 2001. 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 Husband had a TBI and pt had take care of him until his death. Has had bouts of her L knee hurting. In the last 3 years, pt has been falling more. Fell 5 times in August 2020. Was outside walking the dogs and has had a hard time picking up her feet (has had that). Feels more tired since having COVID.  Has not fallen in the past 2 months because she has been paying attention. Fell up the steps which knocked the wind out of her in 09/2019.  Had COVID end of August/beginning September  2020. Wants to prevent falls. Larey Seat one more time in March 2021 when her dogs (jack russel and beagle; pt was walking them at the same time). Pt was on a road with little traffic but it has hills. Pt did not raise her R foot high enough and tripped on a step when pt fell in February 2021. Wonders if her R foot if shorter. When pt fell in August 2020, pt states that she tripped every time. Pt now able to catch herself when she trips. Kneels on her L knee to stand up from a fall. Pt reports tripping on R foot more than the L.  Pt has a house in [place]. Pt currently living in [place]. Planning on returning to [place] in September 2021. [place]: pt lives alone in a 1 story home. 3 steps front door with B rail, 4 steps back door, B rails. [place] home: 1 story home, 1 small step to enter, no rails (not a problem per pt). Same for back door.    Patient Stated Goals Be better able to perform floor to stand transfers.    Currently in Pain? No/denies               Medbridge Access Code [id number] Gait: decreased stance L LE R rail  to go up 4 steps NMR: Foot taps to 12: step forward 2x10BLE Foot taps lateral to 4" inch step 2x10BLE  Step ups on 4" step stool from blue foam, step backward back onto foam 2x10 BLE, no UE support CGA-minA for mild LOB  Static lunge with one UE assist to promote glute max and quad strengthening for gait balance and floor to stand transfers.      R 10x     L 10x Forward and back weight shifting semi tandem stance      R foot in front 10x2     L foot in front 10x2     improving control observed   Improved exercise technique, movement at target joints, use of target muscles after mod verbal, visual, tactile cues.   Clinical impression/pt response: The patient demonstrated excellent motivation throughout therapy. Most challenged by single leg activities as well as toe clearance during interventions. The patient needed CGA-minA throughout for safety. The patient would  benefit from further skilled PT intervention to continue to progress towards goals.          PT Education - 02/11/20 1507    Education Details therex form/technique    Person(s) Educated Patient    Methods Explanation;Demonstration;Tactile cues;Verbal cues    Comprehension Verbalized understanding;Returned demonstration            PT Short Term Goals - 12/14/19 1458      PT SHORT TERM GOAL #1   Title Pt will be independent with her HEP to improve balance and decrease fall risk.    Baseline Pt performing her HEP (12/14/2019)    Time 3    Period Weeks    Status Achieved    Target Date 12/17/19             PT Long Term Goals - 02/01/20 1833      PT LONG TERM GOAL #1   Title Patient will improve bilateral hip extension and abduction strength to improve steadiness with gait, and standing tasks, and decrease fall risk.    Time 8    Period Weeks    Status Achieved      PT LONG TERM GOAL #2   Title Patient will improve her DGI balance score to 19/24 or more as a demonstration of improved balance.    Baseline 13/24 DGI (11/02/2019); 19/24 (12/07/2019)    Time 8    Period Weeks    Status Achieved      PT LONG TERM GOAL #3   Title Patient will have a decrease in low back pain to 4/10 or less at worst to promote ability to peform standing tasks such as washing dishes or sweeping the floor more comfortably    Baseline 7/10 low back pain at most for the past 3 months (11/02/2019); 2/10 low back pain at most for the past 7 days (12/01/2019), (12/14/2019)    Time 8    Period Weeks    Status Achieved      PT LONG TERM GOAL #4   Title Pt will be able to step over a shoe box and clear both feet independently at least 10x each LE to improve balance and decrease fall risk.    Baseline Unable to clear shoe box when stepping over it (11/02/2019).Able to clear shoe box about 65% of the time. (12/31/2019), (01/28/2020)    Time 6    Period Weeks    Status On-going    Target Date 03/17/20  PT LONG TERM GOAL #5   Title Pt will report decreased to no difficulty with stair negotiation with one UE assist (4 regular steps) to promote ability to get into and out of her house more easily.    Baseline Difficulty with stair negotiation secondary to fear of falling (11/02/2019); difficulty secondary to fear of falling. No difficulty wiht B UE assist. Difficulty when carrying something without UE assist (12/01/2019); Difficulty when performing without UE assist. Able to perform with R rail assist (01/28/2020)    Time 5    Period Weeks    Status Achieved      PT LONG TERM GOAL #6   Title Pt will report decreased to no difficulty with stair negotiation WITHOUT UE assist (4 regular steps) to promote ability to get into and out of her house more easily.    Time 6    Period Weeks    Status On-going    Target Date 03/17/20                 Plan - 02/11/20 1508    Clinical Impression Statement The patient demonstrated excellent motivation throughout therapy. Most challenged by single leg activities as well as toe clearance during interventions. The patient needed CGA-minA throughout for safety. The patient would benefit from further skilled PT intervention to continue to progress towards goals.    Personal Factors and Comorbidities Age;Comorbidity 3+;Fitness;Past/Current Experience;Time since onset of injury/illness/exacerbation    Comorbidities B total shoulder, B total knee replacements, low back pain with radiating symptoms, L knee pain, neck pain    Examination-Activity Limitations Lift;Stairs;Carry    Stability/Clinical Decision Making Stable/Uncomplicated    Rehab Potential Fair    PT Frequency 2x / week    PT Duration 6 weeks    PT Treatment/Interventions Therapeutic activities;Gait training;Stair training;Functional mobility training;Therapeutic exercise;Balance training;Neuromuscular re-education;Patient/family education;Manual techniques;Dry needling;Aquatic Therapy;Canalith  Repostioning;Electrical Stimulation;Iontophoresis 4mg /ml Dexamethasone;Vestibular    PT Next Visit Plan posture, scapular strengthening, thoracic extension, trunk, glute strengthening, femoral control, balance, manual techniques, modalities PRN    PT Home Exercise Plan Medbridge Access Code    Consulted and Agree with Plan of Care Patient           Patient will benefit from skilled therapeutic intervention in order to improve the following deficits and impairments:  Pain, Postural dysfunction, Improper body mechanics, Difficulty walking, Decreased strength, Decreased balance, Abnormal gait  Visit Diagnosis: Unsteadiness on feet  Difficulty in walking, not elsewhere classified  Muscle weakness (generalized)  History of falling     Problem List Patient Active Problem List   Diagnosis Date Noted  . Influenza A 08/15/2016    10/13/2016 PT, DPT 3:51 PM,02/11/20   [facility] [address], [place] Phone: [phone]   Fax:  [phone]  Name: Tara Moody MRN: Elmon Else Date of Birth: [birth year]

## 2020-02-15 ENCOUNTER — Ambulatory Visit: Payer: Medicare Other

## 2020-02-18 ENCOUNTER — Ambulatory Visit: Payer: Medicare Other

## 2020-02-18 ENCOUNTER — Other Ambulatory Visit: Payer: Self-pay

## 2020-02-18 DIAGNOSIS — Z9181 History of falling: Secondary | ICD-10-CM

## 2020-02-18 DIAGNOSIS — R2681 Unsteadiness on feet: Secondary | ICD-10-CM | POA: Diagnosis not present

## 2020-02-18 DIAGNOSIS — R262 Difficulty in walking, not elsewhere classified: Secondary | ICD-10-CM

## 2020-02-18 DIAGNOSIS — M6281 Muscle weakness (generalized): Secondary | ICD-10-CM

## 2020-02-18 NOTE — Therapy (Signed)
[facility] [address], [place], [address] Phone: [phone]   Fax:  [phone]  Physical Therapy Treatment  Patient Details  Name: Tara Moody MRN: [id number] Date of Birth: 06-10-1946 Referring Provider (PT): Dierdre Harness, MD   Encounter Date: 02/18/2020   PT End of Session - 02/18/20 1433    Visit Number 24    Number of Visits 39    Date for PT Re-Evaluation 03/17/20    Authorization Type 4    Authorization Time Period of 10 progress report    PT Start Time 1434    PT Stop Time 1514    PT Time Calculation (min) 40 min    Equipment Utilized During Treatment Gait belt    Activity Tolerance Patient tolerated treatment well    Behavior During Therapy WFL for tasks assessed/performed           Past Medical History:  Diagnosis Date  . Hypertension     Past Surgical History:  Procedure Laterality Date  . CATARACT EXTRACTION    . REPLACEMENT TOTAL KNEE BILATERAL      There were no vitals filed for this visit.   Subjective Assessment - 02/18/20 1434    Subjective Got her second covid shot. Feels much better with balance. Wants to re-enforce balance with PT.    Pertinent History Gait abnormality. Pt has had B knee, B shoulder replacements, B carpal tunnel surgeries. Pt hurt her L knee 2001. 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 Husband had a TBI and pt had take care of him until his death. Has had bouts of her L knee hurting. In the last 3 years, pt has been falling more. Fell 5 times in August 2020. Was outside walking the dogs and has had a hard time picking up her feet (has had that). Feels more tired since having COVID.  Has not fallen in the past 2 months because she has been paying attention. Fell up the steps which knocked the wind out of her in 09/2019.  Had COVID end of August/beginning September 2020. Wants to prevent falls. Larey Seat one more time in March 2021 when her dogs (jack russel  and beagle; pt was walking them at the same time). Pt was on a road with little traffic but it has hills. Pt did not raise her R foot high enough and tripped on a step when pt fell in February 2021. Wonders if her R foot if shorter. When pt fell in August 2020, pt states that she tripped every time. Pt now able to catch herself when she trips. Kneels on her L knee to stand up from a fall. Pt reports tripping on R foot more than the L.  Pt has a house in [place]. Pt currently living in [place]. Planning on returning to [place] in September 2021. [place]: pt lives alone in a 1 story home. 3 steps front door with B rail, 4 steps back door, B rails. [place] home: 1 story home, 1 small step to enter, no rails (not a problem per pt). Same for back door.    Patient Stated Goals Be better able to perform floor to stand transfers.    Currently in Pain? No/denies    Pain Score 0-No pain  PT Education - 02/18/20 2151    Education Details ther-ex    Person(s) Educated Patient    Methods Explanation;Demonstration;Tactile cues;Verbal cues    Comprehension Verbalized understanding;Returned demonstration            MedbridgeAccess Code [email or web address] Gait: decreased stance L LE R rail to go up 4 steps   Therapeutic exercise  Gait around gym 75 ft x 3 with PT manual perturbation to simulate 2 dogs pulling   Then with stepping over 3 mini hurdles 75 ft x 3 with PT manual perturbation   Then walking on garden/mulch/incline 3x 40 ft with PT manual perturbation to simulate 2 dogs pulling. No LOB.   SLS with light touch to no UE assist   R 10x10 seconds   L 10x10 seconds   Walking lunges 32 ft x 2  Side step with mini squat 32 ft to the R and 32 ft to the L   Great muscle use felt by pt.   Standing on Air Ex pad with toe taps onto 12 inch step 10x2 each LE   More challenging, LOB x 2, min A to recover.        Improved exercise  technique, movement at target joints, use of target muscles after mod verbal, visual, tactile cues.   Clinical impression/pt response: Able to ambulate with perturbation on flat and uneven surface as well as when stepping over obstacles on flat surface without LOB. Pt improving ability to place and maintain her center of gravity over base of support with standing acitivities/obstacle negotiattion. Pt will benefit from continued skilled physical therapy services to improve strength, balance, and function.       PT Short Term Goals - 12/14/19 1458      PT SHORT TERM GOAL #1   Title Pt will be independent with her HEP to improve balance and decrease fall risk.    Baseline Pt performing her HEP (12/14/2019)    Time 3    Period Weeks    Status Achieved    Target Date 12/17/19             PT Long Term Goals - 02/01/20 1833      PT LONG TERM GOAL #1   Title Patient will improve bilateral hip extension and abduction strength to improve steadiness with gait, and standing tasks, and decrease fall risk.    Time 8    Period Weeks    Status Achieved      PT LONG TERM GOAL #2   Title Patient will improve her DGI balance score to 19/24 or more as a demonstration of improved balance.    Baseline 13/24 DGI (11/02/2019); 19/24 (12/07/2019)    Time 8    Period Weeks    Status Achieved      PT LONG TERM GOAL #3   Title Patient will have a decrease in low back pain to 4/10 or less at worst to promote ability to peform standing tasks such as washing dishes or sweeping the floor more comfortably    Baseline 7/10 low back pain at most for the past 3 months (11/02/2019); 2/10 low back pain at most for the past 7 days (12/01/2019), (12/14/2019)    Time 8    Period Weeks    Status Achieved      PT LONG TERM GOAL #4   Title Pt will be able to step over a shoe box and clear both feet independently at least 10x each LE to improve balance and  decrease fall risk.    Baseline Unable to clear shoe box when  stepping over it (11/02/2019).Able to clear shoe box about 65% of the time. (12/31/2019), (01/28/2020)    Time 6    Period Weeks    Status On-going    Target Date 03/17/20      PT LONG TERM GOAL #5   Title Pt will report decreased to no difficulty with stair negotiation with one UE assist (4 regular steps) to promote ability to get into and out of her house more easily.    Baseline Difficulty with stair negotiation secondary to fear of falling (11/02/2019); difficulty secondary to fear of falling. No difficulty wiht B UE assist. Difficulty when carrying something without UE assist (12/01/2019); Difficulty when performing without UE assist. Able to perform with R rail assist (01/28/2020)    Time 5    Period Weeks    Status Achieved      PT LONG TERM GOAL #6   Title Pt will report decreased to no difficulty with stair negotiation WITHOUT UE assist (4 regular steps) to promote ability to get into and out of her house more easily.    Time 6    Period Weeks    Status On-going    Target Date 03/17/20                 Plan - 02/18/20 2152    Clinical Impression Statement Able to ambulate with perturbation on flat and uneven surface as well as when stepping over obstacles on flat surface without LOB. Pt improving ability to place and maintain her center of gravity over base of support with standing acitivities/obstacle negotiattion. Pt will benefit from continued skilled physical therapy services to improve strength, balance, and function.    Personal Factors and Comorbidities Age;Comorbidity 3+;Fitness;Past/Current Experience;Time since onset of injury/illness/exacerbation    Comorbidities B total shoulder, B total knee replacements, low back pain with radiating symptoms, L knee pain, neck pain    Examination-Activity Limitations Lift;Stairs;Carry    Stability/Clinical Decision Making Stable/Uncomplicated    Rehab Potential Fair    PT Frequency 2x / week    PT Duration 6 weeks    PT  Treatment/Interventions Therapeutic activities;Gait training;Stair training;Functional mobility training;Therapeutic exercise;Balance training;Neuromuscular re-education;Patient/family education;Manual techniques;Dry needling;Aquatic Therapy;Canalith Repostioning;Electrical Stimulation;Iontophoresis 4mg /ml Dexamethasone;Vestibular    PT Next Visit Plan posture, scapular strengthening, thoracic extension, trunk, glute strengthening, femoral control, balance, manual techniques, modalities PRN    PT Home Exercise Plan Medbridge Access Code    Consulted and Agree with Plan of Care Patient           Patient will benefit from skilled therapeutic intervention in order to improve the following deficits and impairments:  Pain, Postural dysfunction, Improper body mechanics, Difficulty walking, Decreased strength, Decreased balance, Abnormal gait  Visit Diagnosis: Unsteadiness on feet  Difficulty in walking, not elsewhere classified  Muscle weakness (generalized)  History of falling     Problem List Patient Active Problem List   Diagnosis Date Noted  . Influenza A 08/15/2016   10/13/2016 PT, DPT   02/18/2020, 9:55 PM  [facility] SPORTS [facility]IC[facility] [address], [address], [place] Phone: [phone]   Fax:  [phone]  Name: Tara Moody MRN: [id number] Else Date of Birth: 01/14/1946

## 2020-02-22 ENCOUNTER — Ambulatory Visit: Payer: Medicare Other

## 2020-02-22 ENCOUNTER — Other Ambulatory Visit: Payer: Self-pay

## 2020-02-22 DIAGNOSIS — R262 Difficulty in walking, not elsewhere classified: Secondary | ICD-10-CM

## 2020-02-22 DIAGNOSIS — R2681 Unsteadiness on feet: Secondary | ICD-10-CM | POA: Diagnosis not present

## 2020-02-22 DIAGNOSIS — M6281 Muscle weakness (generalized): Secondary | ICD-10-CM

## 2020-02-22 DIAGNOSIS — Z9181 History of falling: Secondary | ICD-10-CM

## 2020-02-22 NOTE — Therapy (Signed)
[facility] [address], [place], [address] Phone: [phone]   Fax:  [phone]  Physical Therapy Treatment  Patient Details  Name: Tara Moody MRN: [id number] Date of Birth: 1945/10/31 Referring Provider (PT): Dierdre Harness, MD   Encounter Date: 02/22/2020   PT End of Session - 02/22/20 1306    Visit Number 25    Number of Visits 39    Date for PT Re-Evaluation 03/17/20    Authorization Type 5    Authorization Time Period of 10 progress report    PT Start Time 1306   Pt used restroom at start of session   PT Stop Time 1347    PT Time Calculation (min) 41 min    Equipment Utilized During Treatment Gait belt    Activity Tolerance Patient tolerated treatment well    Behavior During Therapy WFL for tasks assessed/performed           Past Medical History:  Diagnosis Date   Hypertension     Past Surgical History:  Procedure Laterality Date   CATARACT EXTRACTION     REPLACEMENT TOTAL KNEE BILATERAL      There were no vitals filed for this visit.   Subjective Assessment - 02/22/20 1307    Subjective Doing pretty good. No problems walking her dogs.    Pertinent History Gait abnormality. Pt has had B knee, B shoulder replacements, B carpal tunnel surgeries. Pt hurt her L knee 2001. 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 Husband had a TBI and pt had take care of him until his death. Has had bouts of her L knee hurting. In the last 3 years, pt has been falling more. Fell 5 times in August 2020. Was outside walking the dogs and has had a hard time picking up her feet (has had that). Feels more tired since having COVID.  Has not fallen in the past 2 months because she has been paying attention. Fell up the steps which knocked the wind out of her in 09/2019.  Had COVID end of August/beginning September 2020. Wants to prevent falls. Larey Seat one more time in March 2021 when her dogs (jack russel and  beagle; pt was walking them at the same time). Pt was on a road with little traffic but it has hills. Pt did not raise her R foot high enough and tripped on a step when pt fell in February 2021. Wonders if her R foot if shorter. When pt fell in August 2020, pt states that she tripped every time. Pt now able to catch herself when she trips. Kneels on her L knee to stand up from a fall. Pt reports tripping on R foot more than the L.  Pt has a house in [place]. Pt currently living in [place]. Planning on returning to [place] in September 2021. [place]: pt lives alone in a 1 story home. 3 steps front door with B rail, 4 steps back door, B rails. [place] home: 1 story home, 1 small step to enter, no rails (not a problem per pt). Same for back door.    Patient Stated Goals Be better able to perform floor to stand transfers.    Currently in Pain? No/denies                                     PT Education - 02/22/20 1324  Education Details ther-ex    Person(s) Educated Patient    Methods Explanation;Demonstration;Tactile cues;Verbal cues    Comprehension Verbalized understanding;Returned demonstration            MedbridgeAccess Code [email or web address] Gait: decreased stance L LE R rail to go up 4 steps   Therapeutic exercise  SLS with light touch assist PRN  R 10x10 seconds   L 10x10 seconds   Standing on Air Ex pad with toe taps onto 12 inch step 10x3 each LE      More challenging, LOB x 2, min A to recover.  Walking lunges 32 ft x 2   Side step with mini squat 32 ft to the R and 32 ft to the L      Great muscle use felt by pt.   Ascending and descending 4 regular steps without UE assist 2x, then holding   Then with 5 lbs each hand (10 lbs total), no UE assist 2x.   Gait around gym 100 ft x with PT manual perturbation to simulate 2 dogs pulling      Then with stepping over 6 mini hurdles 100 ft x with PT manual perturbation with 6 mini hurdles 3x    Standing hip abduction with B UE assist yellow band to promote glute med muscle strength  R 10x3  L 10x3    Improved exercise technique, movement at target joints, use of target muscles after mod verbal, visual, tactile cues.   Clinical impression/pt response:  Improved ability to negotiate stairs without B UE assist as well as with carrying a load B UE. No LOB. Difficulty with single leg related balance tasks on uneven surface such as toe taps onto higher surfaces. Continued working on obstacle negotiation and gait while receiving manual perturbations to simulate walking her dogs to help decrease fall risk. Pt tolerated session well without aggravation of symptoms. Pt will benefit from continued skilled physical therapy services to improve strength, balance, and function.        PT Short Term Goals - 12/14/19 1458      PT SHORT TERM GOAL #1   Title Pt will be independent with her HEP to improve balance and decrease fall risk.    Baseline Pt performing her HEP (12/14/2019)    Time 3    Period Weeks    Status Achieved    Target Date 12/17/19             PT Long Term Goals - 02/01/20 1833      PT LONG TERM GOAL #1   Title Patient will improve bilateral hip extension and abduction strength to improve steadiness with gait, and standing tasks, and decrease fall risk.    Time 8    Period Weeks    Status Achieved      PT LONG TERM GOAL #2   Title Patient will improve her DGI balance score to 19/24 or more as a demonstration of improved balance.    Baseline 13/24 DGI (11/02/2019); 19/24 (12/07/2019)    Time 8    Period Weeks    Status Achieved      PT LONG TERM GOAL #3   Title Patient will have a decrease in low back pain to 4/10 or less at worst to promote ability to peform standing tasks such as washing dishes or sweeping the floor more comfortably    Baseline 7/10 low back pain at most for the past 3 months (11/02/2019); 2/10 low back pain at most for the past  7 days  (12/01/2019), (12/14/2019)    Time 8    Period Weeks    Status Achieved      PT LONG TERM GOAL #4   Title Pt will be able to step over a shoe box and clear both feet independently at least 10x each LE to improve balance and decrease fall risk.    Baseline Unable to clear shoe box when stepping over it (11/02/2019).Able to clear shoe box about 65% of the time. (12/31/2019), (01/28/2020)    Time 6    Period Weeks    Status On-going    Target Date 03/17/20      PT LONG TERM GOAL #5   Title Pt will report decreased to no difficulty with stair negotiation with one UE assist (4 regular steps) to promote ability to get into and out of her house more easily.    Baseline Difficulty with stair negotiation secondary to fear of falling (11/02/2019); difficulty secondary to fear of falling. No difficulty wiht B UE assist. Difficulty when carrying something without UE assist (12/01/2019); Difficulty when performing without UE assist. Able to perform with R rail assist (01/28/2020)    Time 5    Period Weeks    Status Achieved      PT LONG TERM GOAL #6   Title Pt will report decreased to no difficulty with stair negotiation WITHOUT UE assist (4 regular steps) to promote ability to get into and out of her house more easily.    Time 6    Period Weeks    Status On-going    Target Date 03/17/20                 Plan - 02/22/20 1305    Clinical Impression Statement Improved ability to negotiate stairs without B UE assist as well as with carrying a load B UE. No LOB. Difficulty with single leg related balance tasks on uneven surface such as toe taps onto higher surfaces. Continued working on obstacle negotiation and gait while receiving manual perturbations to simulate walking her dogs to help decrease fall risk. Pt tolerated session well without aggravation of symptoms. Pt will benefit from continued skilled physical therapy services to improve strength, balance, and function.    Personal Factors and  Comorbidities Age;Comorbidity 3+;Fitness;Past/Current Experience;Time since onset of injury/illness/exacerbation    Comorbidities B total shoulder, B total knee replacements, low back pain with radiating symptoms, L knee pain, neck pain    Examination-Activity Limitations Lift;Stairs;Carry    Stability/Clinical Decision Making Stable/Uncomplicated    Rehab Potential Fair    PT Frequency 2x / week    PT Duration 6 weeks    PT Treatment/Interventions Therapeutic activities;Gait training;Stair training;Functional mobility training;Therapeutic exercise;Balance training;Neuromuscular re-education;Patient/family education;Manual techniques;Dry needling;Aquatic Therapy;Canalith Repostioning;Electrical Stimulation;Iontophoresis 4mg /ml Dexamethasone;Vestibular    PT Next Visit Plan posture, scapular strengthening, thoracic extension, trunk, glute strengthening, femoral control, balance, manual techniques, modalities PRN    PT Home Exercise Plan Medbridge Access Code    Consulted and Agree with Plan of Care Patient           Patient will benefit from skilled therapeutic intervention in order to improve the following deficits and impairments:  Pain, Postural dysfunction, Improper body mechanics, Difficulty walking, Decreased strength, Decreased balance, Abnormal gait  Visit Diagnosis: Unsteadiness on feet  Difficulty in walking, not elsewhere classified  Muscle weakness (generalized)  History of falling     Problem List Patient Active Problem List   Diagnosis Date Noted   Influenza A  08/15/2016   Loralyn Freshwater PT, DPT  02/22/2020, 7:13 PM  [place] [facility] PH[facility] AND SPORTS MEDICINE [address] [place], [address] Phone: [phone]   Fax:  [phone]  Name: Tara Moody MRN: [id number] Date of Birth: Nov 08, 1945

## 2020-02-24 ENCOUNTER — Other Ambulatory Visit: Payer: Self-pay

## 2020-02-24 ENCOUNTER — Ambulatory Visit: Payer: Medicare Other

## 2020-02-24 DIAGNOSIS — R2681 Unsteadiness on feet: Secondary | ICD-10-CM

## 2020-02-24 DIAGNOSIS — M6281 Muscle weakness (generalized): Secondary | ICD-10-CM

## 2020-02-24 DIAGNOSIS — R262 Difficulty in walking, not elsewhere classified: Secondary | ICD-10-CM

## 2020-02-24 DIAGNOSIS — Z9181 History of falling: Secondary | ICD-10-CM

## 2020-02-24 NOTE — Therapy (Signed)
[facility] [address], [place], [address] Phone: [phone]   Fax:  [phone]  Physical Therapy Treatment  Patient Details  Name: Tara Moody MRN: [id number] Date of Birth: 15-Dec-1945 Referring Provider (PT): Dierdre Harness, MD   Encounter Date: 02/24/2020   PT End of Session - 02/24/20 1122    Visit Number 26    Number of Visits 39    Date for PT Re-Evaluation 03/17/20    Authorization Type 6    Authorization Time Period of 10 progress report    PT Start Time 1122    PT Stop Time 1201    PT Time Calculation (min) 39 min    Equipment Utilized During Treatment Gait belt    Activity Tolerance Patient tolerated treatment well    Behavior During Therapy WFL for tasks assessed/performed           Past Medical History:  Diagnosis Date  . Hypertension     Past Surgical History:  Procedure Laterality Date  . CATARACT EXTRACTION    . REPLACEMENT TOTAL KNEE BILATERAL      There were no vitals filed for this visit.   Subjective Assessment - 02/24/20 1122    Subjective Doing good. No pian of discomfort.    Pertinent History Gait abnormality. Pt has had B knee, B shoulder replacements, B carpal tunnel surgeries. Pt hurt her L knee 2001. 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 Husband had a TBI and pt had take care of him until his death. Has had bouts of her L knee hurting. In the last 3 years, pt has been falling more. Fell 5 times in August 2020. Was outside walking the dogs and has had a hard time picking up her feet (has had that). Feels more tired since having COVID.  Has not fallen in the past 2 months because she has been paying attention. Fell up the steps which knocked the wind out of her in 09/2019.  Had COVID end of August/beginning September 2020. Wants to prevent falls. Larey Seat one more time in March 2021 when her dogs (jack russel and beagle; pt was walking them at the same time). Pt was on a  road with little traffic but it has hills. Pt did not raise her R foot high enough and tripped on a step when pt fell in February 2021. Wonders if her R foot if shorter. When pt fell in August 2020, pt states that she tripped every time. Pt now able to catch herself when she trips. Kneels on her L knee to stand up from a fall. Pt reports tripping on R foot more than the L.  Pt has a house in [place]. Pt currently living in [place]. Planning on returning to [place] in September 2021. [place]: pt lives alone in a 1 story home. 3 steps front door with B rail, 4 steps back door, B rails. [place] home: 1 story home, 1 small step to enter, no rails (not a problem per pt). Same for back door.    Patient Stated Goals Be better able to perform floor to stand transfers.    Currently in Pain? No/denies    Pain Score 0-No pain                                     PT Education - 02/24/20 1126    Education  Details ther-ex    Person(s) Educated Patient    Methods Explanation;Demonstration;Tactile cues;Verbal cues    Comprehension Returned demonstration;Verbalized understanding          MedbridgeAccess Code [email or web address] Gait: decreased stance L LE R rail to go up 4 steps   Therapeutic exercise  Standing on Air Ex pad with toe taps onto 12 inch step 10x3 each LE   Static deep lunge with one UE assist to promote floor to stand transfers strength  R 10x  L 10x  Floor to stand transfer using chair, CGA to SBA 3x  Able to perform without physical assist.   SLS with light touch assist PRN     R 10x10 seconds      L 10x10 seconds    Standing hip abduction with B UE assist yellow band to promote glute med muscle strength     R 10x3     L 10x3  Side step with mini squat 32 ft to the R and 32 ft to the L   Ascending and descending 4 regular steps without UE assist 2x2    Improved exercise technique, movement at target joints, use of target muscles after mod verbal,  visual, tactile cues.   Clinical impression/pt response:   Continued working on improving bilateral glute med and max and quadriceps strength to improve ability to negotiate obstacles and perform transfers with less difficulty. Pt able to perform floor to stand transfer with B UE assist from chair without PT physical assistance.  Pt tolerated session well without aggravation of symptoms. Pt will benefit from continued skilled physical therapy services to improve strength and function.      PT Short Term Goals - 12/14/19 1458      PT SHORT TERM GOAL #1   Title Pt will be independent with her HEP to improve balance and decrease fall risk.    Baseline Pt performing her HEP (12/14/2019)    Time 3    Period Weeks    Status Achieved    Target Date 12/17/19             PT Long Term Goals - 02/01/20 1833      PT LONG TERM GOAL #1   Title Patient will improve bilateral hip extension and abduction strength to improve steadiness with gait, and standing tasks, and decrease fall risk.    Time 8    Period Weeks    Status Achieved      PT LONG TERM GOAL #2   Title Patient will improve her DGI balance score to 19/24 or more as a demonstration of improved balance.    Baseline 13/24 DGI (11/02/2019); 19/24 (12/07/2019)    Time 8    Period Weeks    Status Achieved      PT LONG TERM GOAL #3   Title Patient will have a decrease in low back pain to 4/10 or less at worst to promote ability to peform standing tasks such as washing dishes or sweeping the floor more comfortably    Baseline 7/10 low back pain at most for the past 3 months (11/02/2019); 2/10 low back pain at most for the past 7 days (12/01/2019), (12/14/2019)    Time 8    Period Weeks    Status Achieved      PT LONG TERM GOAL #4   Title Pt will be able to step over a shoe box and clear both feet independently at least 10x each LE to improve balance and decrease fall  risk.    Baseline Unable to clear shoe box when stepping over it  (11/02/2019).Able to clear shoe box about 65% of the time. (12/31/2019), (01/28/2020)    Time 6    Period Weeks    Status On-going    Target Date 03/17/20      PT LONG TERM GOAL #5   Title Pt will report decreased to no difficulty with stair negotiation with one UE assist (4 regular steps) to promote ability to get into and out of her house more easily.    Baseline Difficulty with stair negotiation secondary to fear of falling (11/02/2019); difficulty secondary to fear of falling. No difficulty wiht B UE assist. Difficulty when carrying something without UE assist (12/01/2019); Difficulty when performing without UE assist. Able to perform with R rail assist (01/28/2020)    Time 5    Period Weeks    Status Achieved      PT LONG TERM GOAL #6   Title Pt will report decreased to no difficulty with stair negotiation WITHOUT UE assist (4 regular steps) to promote ability to get into and out of her house more easily.    Time 6    Period Weeks    Status On-going    Target Date 03/17/20                 Plan - 02/24/20 1216    Clinical Impression Statement Continued working on improving bilateral glute med and max and quadriceps strength to improve ability to negotiate obstacles and perform transfers with less difficulty. Pt able to perform floor to stand transfer with B UE assist from chair without PT physical assistance.  Pt tolerated session well without aggravation of symptoms. Pt will benefit from continued skilled physical therapy services to improve strength and function.    Personal Factors and Comorbidities Age;Comorbidity 3+;Fitness;Past/Current Experience;Time since onset of injury/illness/exacerbation    Comorbidities B total shoulder, B total knee replacements, low back pain with radiating symptoms, L knee pain, neck pain    Examination-Activity Limitations Lift;Stairs;Carry    Stability/Clinical Decision Making Stable/Uncomplicated    Rehab Potential Fair    PT Frequency 2x / week     PT Duration 6 weeks    PT Treatment/Interventions Therapeutic activities;Gait training;Stair training;Functional mobility training;Therapeutic exercise;Balance training;Neuromuscular re-education;Patient/family education;Manual techniques;Dry needling;Aquatic Therapy;Canalith Repostioning;Electrical Stimulation;Iontophoresis 4mg /ml Dexamethasone;Vestibular    PT Next Visit Plan posture, scapular strengthening, thoracic extension, trunk, glute strengthening, femoral control, balance, manual techniques, modalities PRN    PT Home Exercise Plan Medbridge Access Code    Consulted and Agree with Plan of Care Patient           Patient will benefit from skilled therapeutic intervention in order to improve the following deficits and impairments:  Pain, Postural dysfunction, Improper body mechanics, Difficulty walking, Decreased strength, Decreased balance, Abnormal gait  Visit Diagnosis: Unsteadiness on feet  Difficulty in walking, not elsewhere classified  Muscle weakness (generalized)  History of falling     Problem List Patient Active Problem List   Diagnosis Date Noted  . Influenza A 08/15/2016    10/13/2016 PT, DPT   02/24/2020, 12:17 PM  [facility] PH[facility] SPORTS MEDICINE [address], [address], [place] Phone: [phone]   Fax:  [phone]  Name: Tara Moody MRN: Elmon Else Date of Birth: [birth year]

## 2020-02-29 ENCOUNTER — Other Ambulatory Visit: Payer: Self-pay

## 2020-02-29 ENCOUNTER — Ambulatory Visit: Payer: Medicare Other

## 2020-02-29 DIAGNOSIS — R2681 Unsteadiness on feet: Secondary | ICD-10-CM

## 2020-02-29 DIAGNOSIS — R262 Difficulty in walking, not elsewhere classified: Secondary | ICD-10-CM

## 2020-02-29 DIAGNOSIS — Z9181 History of falling: Secondary | ICD-10-CM

## 2020-02-29 DIAGNOSIS — M6281 Muscle weakness (generalized): Secondary | ICD-10-CM

## 2020-02-29 NOTE — Therapy (Signed)
[facility] [address], [place], [address] Phone: [phone]   Fax:  [phone]  Physical Therapy Treatment  Patient Details  Name: Tara Moody MRN: [id number] Date of Birth: [birth year] Referring Provider (PT): Dierdre Harness, MD   Encounter Date: 02/29/2020   PT End of Session - 02/29/20 1033    Visit Number 27    Number of Visits 39    Date for PT Re-Evaluation 03/17/20    Authorization Type 7    Authorization Time Period of 10 progress report    PT Start Time 1034    PT Stop Time 1115    PT Time Calculation (min) 41 min    Equipment Utilized During Treatment Gait belt    Activity Tolerance Patient tolerated treatment well    Behavior During Therapy WFL for tasks assessed/performed           Past Medical History:  Diagnosis Date  . Hypertension     Past Surgical History:  Procedure Laterality Date  . CATARACT EXTRACTION    . REPLACEMENT TOTAL KNEE BILATERAL      There were no vitals filed for this visit.   Subjective Assessment - 02/29/20 1032    Subjective Patient reported that she is doing well today, no falls to report    Pertinent History Gait abnormality. Pt has had B knee, B shoulder replacements, B carpal tunnel surgeries. Pt hurt her L knee 2001. 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 Husband had a TBI and pt had take care of him until his death. Has had bouts of her L knee hurting. In the last 3 years, pt has been falling more. Fell 5 times in August 2020. Was outside walking the dogs and has had a hard time picking up her feet (has had that). Feels more tired since having COVID.  Has not fallen in the past 2 months because she has been paying attention. Fell up the steps which knocked the wind out of her in 09/2019.  Had COVID end of August/beginning September 2020. Wants to prevent falls. Larey Seat one more time in March 2021 when her dogs (jack russel and beagle; pt was walking them  at the same time). Pt was on a road with little traffic but it has hills. Pt did not raise her R foot high enough and tripped on a step when pt fell in February 2021. Wonders if her R foot if shorter. When pt fell in August 2020, pt states that she tripped every time. Pt now able to catch herself when she trips. Kneels on her L knee to stand up from a fall. Pt reports tripping on R foot more than the L.  Pt has a house in [place]. Pt currently living in [place]. Planning on returning to [place] in September 2021. [place]: pt lives alone in a 1 story home. 3 steps front door with B rail, 4 steps back door, B rails. [place] home: 1 story home, 1 small step to enter, no rails (not a problem per pt). Same for back door.    Patient Stated Goals Be better able to perform floor to stand transfers.    Currently in Pain? No/denies            Medbridge Access Code [id number] Gait: decreased stance L LE R rail to go up 4 steps     Therapeutic exercise   Standing on Air Ex pad with toe taps onto 12 inch  step 10x3 each LE    Toe taps to cone 2x10 bilaterally no UE support, CGA-minA cueing for weight shift and to avoid knocking cone over  Standing lateral taps to yellow dynadisc, cues for weight shift, 2x10 with CGa-minA (1LOB noted). No UE support  Static deep lunge with one UE assist to promote floor to stand transfers strength, CGA      R 10x     L 10x     SLS with light touch assist PRN     R 10x10 seconds      L 10x10 seconds      Standing hip abduction with B UE assist yellow band to promote glute med muscle strength     R 10x3     L 10x3   Side step with mini squat 32 ft to the R and 32 ft to the L    Ascending and descending 4 regular steps without UE assist 2x2     Improved exercise technique, movement at target joints, use of target muscles after mod verbal, visual, tactile cues.    Clinical impression/pt response:    Session focused on promoting stability during dynamic tasks  as well as posterior glute strength. Pt did require several seated rest breaks, but able to perform all exercises as planned. No discomfort reported. CGA-MinA for safety during interventions, pt able to perform many without UE support. The pt would benefit from continued skilled physical therapy services to improve strength and function.       PT Education - 02/29/20 1033    Education Details therex    Person(s) Educated Patient    Methods Explanation;Demonstration;Tactile cues;Verbal cues    Comprehension Verbalized understanding;Returned demonstration;Verbal cues required;Tactile cues required            PT Short Term Goals - 12/14/19 1458      PT SHORT TERM GOAL #1   Title Pt will be independent with her HEP to improve balance and decrease fall risk.    Baseline Pt performing her HEP (12/14/2019)    Time 3    Period Weeks    Status Achieved    Target Date 12/17/19             PT Long Term Goals - 02/01/20 1833      PT LONG TERM GOAL #1   Title Patient will improve bilateral hip extension and abduction strength to improve steadiness with gait, and standing tasks, and decrease fall risk.    Time 8    Period Weeks    Status Achieved      PT LONG TERM GOAL #2   Title Patient will improve her DGI balance score to 19/24 or more as a demonstration of improved balance.    Baseline 13/24 DGI (11/02/2019); 19/24 (12/07/2019)    Time 8    Period Weeks    Status Achieved      PT LONG TERM GOAL #3   Title Patient will have a decrease in low back pain to 4/10 or less at worst to promote ability to peform standing tasks such as washing dishes or sweeping the floor more comfortably    Baseline 7/10 low back pain at most for the past 3 months (11/02/2019); 2/10 low back pain at most for the past 7 days (12/01/2019), (12/14/2019)    Time 8    Period Weeks    Status Achieved      PT LONG TERM GOAL #4   Title Pt will be able to step  over a shoe box and clear both feet independently at  least 10x each LE to improve balance and decrease fall risk.    Baseline Unable to clear shoe box when stepping over it (11/02/2019).Able to clear shoe box about 65% of the time. (12/31/2019), (01/28/2020)    Time 6    Period Weeks    Status On-going    Target Date 03/17/20      PT LONG TERM GOAL #5   Title Pt will report decreased to no difficulty with stair negotiation with one UE assist (4 regular steps) to promote ability to get into and out of her house more easily.    Baseline Difficulty with stair negotiation secondary to fear of falling (11/02/2019); difficulty secondary to fear of falling. No difficulty wiht B UE assist. Difficulty when carrying something without UE assist (12/01/2019); Difficulty when performing without UE assist. Able to perform with R rail assist (01/28/2020)    Time 5    Period Weeks    Status Achieved      PT LONG TERM GOAL #6   Title Pt will report decreased to no difficulty with stair negotiation WITHOUT UE assist (4 regular steps) to promote ability to get into and out of her house more easily.    Time 6    Period Weeks    Status On-going    Target Date 03/17/20                 Plan - 02/29/20 1033    Clinical Impression Statement Session focused on promoting stability during dynamic tasks as well as posterior glute strength. Pt did require several seated rest breaks, but able to perform all exercises as planned. No discomfort reported. CGA-MinA for safety during interventions, pt able to perform many without UE support. The pt would benefit from continued skilled physical therapy services to improve strength and function.    Personal Factors and Comorbidities Age;Comorbidity 3+;Fitness;Past/Current Experience;Time since onset of injury/illness/exacerbation    Comorbidities B total shoulder, B total knee replacements, low back pain with radiating symptoms, L knee pain, neck pain    Examination-Activity Limitations Lift;Stairs;Carry    Stability/Clinical  Decision Making Stable/Uncomplicated    Rehab Potential Fair    PT Frequency 2x / week    PT Duration 6 weeks    PT Treatment/Interventions Therapeutic activities;Gait training;Stair training;Functional mobility training;Therapeutic exercise;Balance training;Neuromuscular re-education;Patient/family education;Manual techniques;Dry needling;Aquatic Therapy;Canalith Repostioning;Electrical Stimulation;Iontophoresis 4mg /ml Dexamethasone;Vestibular    PT Next Visit Plan posture, scapular strengthening, thoracic extension, trunk, glute strengthening, femoral control, balance, manual techniques, modalities PRN    PT Home Exercise Plan Medbridge Access Code    Consulted and Agree with Plan of Care Patient           Patient will benefit from skilled therapeutic intervention in order to improve the following deficits and impairments:  Pain, Postural dysfunction, Improper body mechanics, Difficulty walking, Decreased strength, Decreased balance, Abnormal gait  Visit Diagnosis: Unsteadiness on feet  Difficulty in walking, not elsewhere classified  Muscle weakness (generalized)  History of falling     Problem List Patient Active Problem List   Diagnosis Date Noted  . Influenza A 08/15/2016    10/13/2016 PT, DPT 11:12 AM,02/29/20   [facility] [address], [address], [place] Phone: [phone]   Fax:  [phone]  Name: Tara Moody MRN: Elmon Else Date of Birth: [birth year]

## 2020-03-03 ENCOUNTER — Ambulatory Visit: Payer: Medicare Other

## 2020-03-03 ENCOUNTER — Other Ambulatory Visit: Payer: Self-pay

## 2020-03-03 DIAGNOSIS — R262 Difficulty in walking, not elsewhere classified: Secondary | ICD-10-CM

## 2020-03-03 DIAGNOSIS — M6281 Muscle weakness (generalized): Secondary | ICD-10-CM

## 2020-03-03 DIAGNOSIS — R2681 Unsteadiness on feet: Secondary | ICD-10-CM | POA: Diagnosis not present

## 2020-03-03 DIAGNOSIS — Z9181 History of falling: Secondary | ICD-10-CM

## 2020-03-03 NOTE — Therapy (Signed)
[facility] [address] [place], [address] Phone: [phone]   Fax:  [phone]  Physical Therapy Treatment  Patient Details  Name: Tara Moody MRN: [id number] Date of Birth: 01/08/1946 Referring Provider (PT): Dierdre Harness, MD   Encounter Date: 03/03/2020   PT End of Session - 03/03/20 1449    Visit Number 28    Number of Visits 39    Date for PT Re-Evaluation 03/17/20    Authorization Type 8    Authorization Time Period of 10 progress report    PT Start Time 1445    PT Stop Time 1515    PT Time Calculation (min) 30 min    Equipment Utilized During Treatment Gait belt    Activity Tolerance Patient tolerated treatment well    Behavior During Therapy [facility] [place] for tasks assessed/performed           Past Medical History:  Diagnosis Date  . Hypertension     Past Surgical History:  Procedure Laterality Date  . CATARACT EXTRACTION    . REPLACEMENT TOTAL KNEE BILATERAL      There were no vitals filed for this visit.   Subjective Assessment - 03/03/20 1449    Subjective Patient reported she is doing well today, no pain.    Pertinent History Gait abnormality. Pt has had B knee, B shoulder replacements, B carpal tunnel surgeries. Pt hurt her L knee 2001. 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 Husband had a TBI and pt had take care of him until his death. Has had bouts of her L knee hurting. In the last 3 years, pt has been falling more. Fell 5 times in August 2020. Was outside walking the dogs and has had a hard time picking up her feet (has had that). Feels more tired since having COVID.  Has not fallen in the past 2 months because she has been paying attention. Fell up the steps which knocked the wind out of her in 09/2019.  Had COVID end of August/beginning September 2020. Wants to prevent falls. Larey Seat one more time in March 2021 when her dogs (jack russel and beagle; pt was walking them at the same  time). Pt was on a road with little traffic but it has hills. Pt did not raise her R foot high enough and tripped on a step when pt fell in February 2021. Wonders if her R foot if shorter. When pt fell in August 2020, pt states that she tripped every time. Pt now able to catch herself when she trips. Kneels on her L knee to stand up from a fall. Pt reports tripping on R foot more than the L.  Pt has a house in [place]. Pt currently living in [place]. Planning on returning to [place] in September 2021. [place]: pt lives alone in a 1 story home. 3 steps front door with B rail, 4 steps back door, B rails. [place] home: 1 story home, 1 small step to enter, no rails (not a problem per pt). Same for back door.    Patient Stated Goals Be better able to perform floor to stand transfers.    Currently in Pain? No/denies    Pain Onset More than a month ago               Medbridge Access Code [phone] Gait: decreased stance L LE R rail to go up 4 steps     Therapeutic exercise   Standing  on Air Ex pad with toe taps onto 12 inch step 10x3 each LE no UE support   Toe taps to cone 2x10 bilaterally no UE support, CGA-minA cueing for weight shift and cued to avoid knocking cone over  Standing lateral taps to yellow dynadisc, cues for weight shift, 2x15 with CGa-minA No UE support   One foot on blue foam, one foot on 12 in step with head turns 3x30sec ea foot  SLS with light touch assist PRN     R 10x10 seconds      L 10x10 seconds      Standing hip abduction with B UE assist yellow band to promote glute med muscle strength     R 10x3     L 10x3     Improved exercise technique, movement at target joints, use of target muscles after mod verbal, visual, tactile cues.    Clinical impression/pt response:  Pt 15 minutes late to appointment, shortened accordingly. Pt challenged by toe clearance when fatigued. Encouraged to weight shift throughout to improve preparation for single limb activities.  The patient would benefit from further skilled PT to improve strength and function.      PT Education - 03/03/20 1449    Education Details therex    Person(s) Educated Patient    Methods Explanation;Demonstration;Tactile cues;Verbal cues    Comprehension Verbalized understanding;Returned demonstration;Verbal cues required;Tactile cues required            PT Short Term Goals - 12/14/19 1458      PT SHORT TERM GOAL #1   Title Pt will be independent with her HEP to improve balance and decrease fall risk.    Baseline Pt performing her HEP (12/14/2019)    Time 3    Period Weeks    Status Achieved    Target Date 12/17/19             PT Long Term Goals - 02/01/20 1833      PT LONG TERM GOAL #1   Title Patient will improve bilateral hip extension and abduction strength to improve steadiness with gait, and standing tasks, and decrease fall risk.    Time 8    Period Weeks    Status Achieved      PT LONG TERM GOAL #2   Title Patient will improve her DGI balance score to 19/24 or more as a demonstration of improved balance.    Baseline 13/24 DGI (11/02/2019); 19/24 (12/07/2019)    Time 8    Period Weeks    Status Achieved      PT LONG TERM GOAL #3   Title Patient will have a decrease in low back pain to 4/10 or less at worst to promote ability to peform standing tasks such as washing dishes or sweeping the floor more comfortably    Baseline 7/10 low back pain at most for the past 3 months (11/02/2019); 2/10 low back pain at most for the past 7 days (12/01/2019), (12/14/2019)    Time 8    Period Weeks    Status Achieved      PT LONG TERM GOAL #4   Title Pt will be able to step over a shoe box and clear both feet independently at least 10x each LE to improve balance and decrease fall risk.    Baseline Unable to clear shoe box when stepping over it (11/02/2019).Able to clear shoe box about 65% of the time. (12/31/2019), (01/28/2020)    Time 6    Period Weeks  Status On-going     Target Date 03/17/20      PT LONG TERM GOAL #5   Title Pt will report decreased to no difficulty with stair negotiation with one UE assist (4 regular steps) to promote ability to get into and out of her house more easily.    Baseline Difficulty with stair negotiation secondary to fear of falling (11/02/2019); difficulty secondary to fear of falling. No difficulty wiht B UE assist. Difficulty when carrying something without UE assist (12/01/2019); Difficulty when performing without UE assist. Able to perform with R rail assist (01/28/2020)    Time 5    Period Weeks    Status Achieved      PT LONG TERM GOAL #6   Title Pt will report decreased to no difficulty with stair negotiation WITHOUT UE assist (4 regular steps) to promote ability to get into and out of her house more easily.    Time 6    Period Weeks    Status On-going    Target Date 03/17/20                 Plan - 03/03/20 1516    Clinical Impression Statement Pt 15 minutes late to appointment, shortened accordingly. Pt challenged by toe clearance when fatigued. Encouraged to weight shift throughout to improve preparation for single limb activities. The patient would benefit from further skilled PT to improve strength and function.    Personal Factors and Comorbidities Age;Comorbidity 3+;Fitness;Past/Current Experience;Time since onset of injury/illness/exacerbation    Comorbidities B total shoulder, B total knee replacements, low back pain with radiating symptoms, L knee pain, neck pain    Examination-Activity Limitations Lift;Stairs;Carry    Stability/Clinical Decision Making Stable/Uncomplicated    Rehab Potential Fair    PT Frequency 2x / week    PT Duration 6 weeks    PT Treatment/Interventions Therapeutic activities;Gait training;Stair training;Functional mobility training;Therapeutic exercise;Balance training;Neuromuscular re-education;Patient/family education;Manual techniques;Dry needling;Aquatic Therapy;Canalith  Repostioning;Electrical Stimulation;Iontophoresis 4mg /ml Dexamethasone;Vestibular    PT Next Visit Plan posture, scapular strengthening, thoracic extension, trunk, glute strengthening, femoral control, balance, manual techniques, modalities PRN    PT Home Exercise Plan Medbridge Access Code    Consulted and Agree with Plan of Care Patient           Patient will benefit from skilled therapeutic intervention in order to improve the following deficits and impairments:  Pain, Postural dysfunction, Improper body mechanics, Difficulty walking, Decreased strength, Decreased balance, Abnormal gait  Visit Diagnosis: Unsteadiness on feet  Difficulty in walking, not elsewhere classified  Muscle weakness (generalized)  History of falling     Problem List Patient Active Problem List   Diagnosis Date Noted  . Influenza A 08/15/2016    10/13/2016 PT, DPT 3:31 PM,03/03/20   [facility] PHYS[facility] AND SPORTS MEDICINE [address], [place] Phone: [phone]   Fax:  [phone]  Name: Tara Moody MRN: Elmon Else Date of Birth: 12/30/1945

## 2020-03-08 ENCOUNTER — Ambulatory Visit: Payer: Medicare Other | Attending: Infectious Diseases

## 2020-03-08 ENCOUNTER — Other Ambulatory Visit: Payer: Self-pay

## 2020-03-08 DIAGNOSIS — Z9181 History of falling: Secondary | ICD-10-CM | POA: Insufficient documentation

## 2020-03-08 DIAGNOSIS — R262 Difficulty in walking, not elsewhere classified: Secondary | ICD-10-CM | POA: Insufficient documentation

## 2020-03-08 DIAGNOSIS — M6281 Muscle weakness (generalized): Secondary | ICD-10-CM | POA: Insufficient documentation

## 2020-03-08 DIAGNOSIS — R2681 Unsteadiness on feet: Secondary | ICD-10-CM | POA: Diagnosis present

## 2020-03-08 NOTE — Therapy (Signed)
[facility] [address], [place], [address] Phone: [phone]   Fax:  [phone]  Physical Therapy Treatment  Patient Details  Name: Tara Moody MRN: [id number] Date of Birth: 1945/08/26 Referring Provider (PT): Dierdre Harness, MD   Encounter Date: 03/08/2020   PT End of Session - 03/08/20 1307    Visit Number 29    Number of Visits 39    Date for PT Re-Evaluation 03/17/20    Authorization Type 9    Authorization Time Period of 10 progress report    PT Start Time 1307    PT Stop Time 1340    PT Time Calculation (min) 33 min    Equipment Utilized During Treatment Gait belt    Activity Tolerance Patient tolerated treatment well    Behavior During Therapy WFL for tasks assessed/performed           Past Medical History:  Diagnosis Date  . Hypertension     Past Surgical History:  Procedure Laterality Date  . CATARACT EXTRACTION    . REPLACEMENT TOTAL KNEE BILATERAL      There were no vitals filed for this visit.   Subjective Assessment - 03/08/20 1308    Subjective No pain or discomfort. Doing some extra things around the house. Pleased that she is better than she was.    Pertinent History Gait abnormality. Pt has had B knee, B shoulder replacements, B carpal tunnel surgeries. Pt hurt her L knee 2001. 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 Husband had a TBI and pt had take care of him until his death. Has had bouts of her L knee hurting. In the last 3 years, pt has been falling more. Fell 5 times in August 2020. Was outside walking the dogs and has had a hard time picking up her feet (has had that). Feels more tired since having COVID.  Has not fallen in the past 2 months because she has been paying attention. Fell up the steps which knocked the wind out of her in 09/2019.  Had COVID end of August/beginning September 2020. Wants to prevent falls. Larey Seat one more time in March 2021 when her dogs (jack  russel and beagle; pt was walking them at the same time). Pt was on a road with little traffic but it has hills. Pt did not raise her R foot high enough and tripped on a step when pt fell in February 2021. Wonders if her R foot if shorter. When pt fell in August 2020, pt states that she tripped every time. Pt now able to catch herself when she trips. Kneels on her L knee to stand up from a fall. Pt reports tripping on R foot more than the L.  Pt has a house in [place]. Pt currently living in [place]. Planning on returning to [place] in September 2021. [place]: pt lives alone in a 1 story home. 3 steps front door with B rail, 4 steps back door, B rails. [place] home: 1 story home, 1 small step to enter, no rails (not a problem per pt). Same for back door.    Patient Stated Goals Be better able to perform floor to stand transfers.    Currently in Pain? No/denies    Pain Onset More than a month ago  PT Education - 03/08/20 1452    Education Details ther-ex    Person(s) Educated Patient    Methods Explanation;Demonstration;Tactile cues;Verbal cues    Comprehension Returned demonstration;Verbalized understanding             MedbridgeAccess Code [email or web address] Gait: decreased stance L LE R rail to go up 4 steps   Therapeutic exercise  Standing on Air Ex pad with toe taps onto 12 inch step 10x3each LEno UE support  SLS with light touch assist PRN R 10x10 seconds  L 10x10 seconds   Standing hip abduction with B UE assist yellow bandaround ankles to promote glute med muscle strength R 10x3 L 10x3  Standing hip extension with B UE assist yellow bandaround ankles to promote glute max muscle strength  R 10x3  L 10x3  Stepping over 6 mini hurdles 8x, alternating LE, cues for increasing foot clearance.   Toe taps to cone 2x10 bilaterally no UE support, CGA-minA cueing for weight shift and cued to avoid  knocking cone over     Improved exercise technique, movement at target joints, use of target muscles after mod verbal, visual, tactile cues.   Clinical impression/pt response:  Improving single leg balance overall observed but still demonstrates difficulty with L LE single leg balance with LOB at times. Continued working on glute med and max strength to help address. Pt tolerated session well without aggravatoin of symptoms. Pt will benefit from continued [facility] physical therapy services to improve strength and function.          PT Short Term Goals - 12/14/19 1458      PT SHORT TERM GOAL #1   Title Pt will be independent with her HEP to improve balance and decrease fall risk.    Baseline Pt performing her HEP (12/14/2019)    Time 3    Period Weeks    Status Achieved    Target Date 12/17/19             PT Long Term Goals - 02/01/20 1833      PT LONG TERM GOAL #1   Title Patient will improve bilateral hip extension and abduction strength to improve steadiness with gait, and standing tasks, and decrease fall risk.    Time 8    Period Weeks    Status Achieved      PT LONG TERM GOAL #2   Title Patient will improve her DGI balance score to 19/24 or more as a demonstration of improved balance.    Baseline 13/24 DGI (11/02/2019); 19/24 (12/07/2019)    Time 8    Period Weeks    Status Achieved      PT LONG TERM GOAL #3   Title Patient will have a decrease in low back pain to 4/10 or less at worst to promote ability to peform standing tasks such as washing dishes or sweeping the floor more comfortably    Baseline 7/10 low back pain at most for the past 3 months (11/02/2019); 2/10 low back pain at most for the past 7 days (12/01/2019), (12/14/2019)    Time 8    Period Weeks    Status Achieved      PT LONG TERM GOAL #4   Title Pt will be able to step over a shoe box and clear both feet independently at least 10x each LE to improve balance and decrease fall risk.     Baseline Unable to clear shoe box when stepping over it (11/02/2019).Able to clear shoe box about  65% of the time. (12/31/2019), (01/28/2020)    Time 6    Period Weeks    Status On-going    Target Date 03/17/20      PT LONG TERM GOAL #5   Title Pt will report decreased to no difficulty with stair negotiation with one UE assist (4 regular steps) to promote ability to get into and out of her house more easily.    Baseline Difficulty with stair negotiation secondary to fear of falling (11/02/2019); difficulty secondary to fear of falling. No difficulty wiht B UE assist. Difficulty when carrying something without UE assist (12/01/2019); Difficulty when performing without UE assist. Able to perform with R rail assist (01/28/2020)    Time 5    Period Weeks    Status Achieved      PT LONG TERM GOAL #6   Title Pt will report decreased to no difficulty with stair negotiation WITHOUT UE assist (4 regular steps) to promote ability to get into and out of her house more easily.    Time 6    Period Weeks    Status On-going    Target Date 03/17/20                 Plan - 03/08/20 1451    Clinical Impression Statement Improving single leg balance overall observed but still demonstrates difficulty with L LE single leg balance with LOB at times. Continued working on glute med and max strength to help address. Pt tolerated session well without aggravatoin of symptoms. Pt will benefit from continued [facility] physical therapy services to improve strength and function.    Personal Factors and Comorbidities Age;Comorbidity 3+;Fitness;Past/Current Experience;Time since onset of injury/illness/exacerbation    Comorbidities B total shoulder, B total knee replacements, low back pain with radiating symptoms, L knee pain, neck pain    Examination-Activity Limitations Lift;Stairs;Carry    Stability/Clinical Decision Making Stable/Uncomplicated    Rehab Potential Fair    PT Frequency 2x / week    PT Duration 6 weeks     PT Treatment/Interventions Therapeutic activities;Gait training;Stair training;Functional mobility training;Therapeutic exercise;Balance training;Neuromuscular re-education;Patient/family education;Manual techniques;Dry needling;Aquatic Therapy;Canalith Repostioning;Electrical Stimulation;Iontophoresis 4mg /ml Dexamethasone;Vestibular    PT Next Visit Plan posture, scapular strengthening, thoracic extension, trunk, glute strengthening, femoral control, balance, manual techniques, modalities PRN    PT Home Exercise Plan Medbridge Access Code    Consulted and Agree with Plan of Care Patient           Patient will benefit from skilled therapeutic intervention in order to improve the following deficits and impairments:  Pain, Postural dysfunction, Improper body mechanics, Difficulty walking, Decreased strength, Decreased balance, Abnormal gait  Visit Diagnosis: Unsteadiness on feet  Difficulty in walking, not elsewhere classified  Muscle weakness (generalized)  History of falling     Problem List Patient Active Problem List   Diagnosis Date Noted  . Influenza A 08/15/2016    10/13/2016 PT, DPT   03/08/2020, 2:55 PM  [facility] PHYSICAL AND SPORTS MEDICINE [address], [address], [place] Phone: [phone]   Fax:  [phone]  Name: Tara Moody MRN: Elmon Else Date of Birth: 03-Apr-1946

## 2020-03-10 ENCOUNTER — Other Ambulatory Visit: Payer: Self-pay

## 2020-03-10 ENCOUNTER — Ambulatory Visit: Payer: Medicare Other

## 2020-03-10 DIAGNOSIS — Z9181 History of falling: Secondary | ICD-10-CM

## 2020-03-10 DIAGNOSIS — R2681 Unsteadiness on feet: Secondary | ICD-10-CM | POA: Diagnosis not present

## 2020-03-10 DIAGNOSIS — R262 Difficulty in walking, not elsewhere classified: Secondary | ICD-10-CM

## 2020-03-10 DIAGNOSIS — M6281 Muscle weakness (generalized): Secondary | ICD-10-CM

## 2020-03-10 NOTE — Therapy (Signed)
[place] [facility] [address] [place], [address] Phone: [phone]   Fax:  [phone]  Physical Therapy Treatment And Progress Report (02/01/2020 - 03/10/2020)  Patient Details  Name: Tara Moody MRN: [id number] Date of Birth: 02-03-1946 Referring Provider (PT): Dierdre Harness, MD   Encounter Date: 03/10/2020   PT End of Session - 03/10/20 1605    Visit Number 30    Number of Visits 39    Date for PT Re-Evaluation 03/17/20    Authorization Type 10    Authorization Time Period of 10 progress report    PT Start Time 1606    PT Stop Time 1647    PT Time Calculation (min) 41 min    Equipment Utilized During Treatment Gait belt    Activity Tolerance Patient tolerated treatment well    Behavior During Therapy WFL for tasks assessed/performed           Past Medical History:  Diagnosis Date  . Hypertension     Past Surgical History:  Procedure Laterality Date  . CATARACT EXTRACTION    . REPLACEMENT TOTAL KNEE BILATERAL      There were no vitals filed for this visit.   Subjective Assessment - 03/10/20 1607    Subjective Doing good. Balance feels pretty good but not perfect.    Pertinent History Gait abnormality. Pt has had B knee, B shoulder replacements, B carpal tunnel surgeries. Pt hurt her L knee 2001. 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 Husband had a TBI and pt had take care of him until his death. Has had bouts of her L knee hurting. In the last 3 years, pt has been falling more. Fell 5 times in August 2020. Was outside walking the dogs and has had a hard time picking up her feet (has had that). Feels more tired since having COVID.  Has not fallen in the past 2 months because she has been paying attention. Fell up the steps which knocked the wind out of her in 09/2019.  Had COVID end of August/beginning September 2020. Wants to prevent falls. Larey Seat one more time in March 2021 when her dogs (jack  russel and beagle; pt was walking them at the same time). Pt was on a road with little traffic but it has hills. Pt did not raise her R foot high enough and tripped on a step when pt fell in February 2021. Wonders if her R foot if shorter. When pt fell in August 2020, pt states that she tripped every time. Pt now able to catch herself when she trips. Kneels on her L knee to stand up from a fall. Pt reports tripping on R foot more than the L.  Pt has a house in [place]. Pt currently living in . Planning on returning to [place] in September 2021. [place]: pt lives alone in a 1 story home. 3 steps front door with B rail, 4 steps back door, B rails. [place] home: 1 story home, 1 small step to enter, no rails (not a problem per pt). Same for back door.    Patient Stated Goals Be better able to perform floor to stand transfers.    Currently in Pain? No/denies    Pain Score 0-No pain    Pain Onset More than a month ago  PT Education - 03/10/20 1618    Education Details ther-ex    Person(s) Educated Patient    Methods Explanation;Demonstration;Tactile cues;Verbal cues    Comprehension Returned demonstration;Verbalized understanding          MedbridgeAccess Code [email or web address] Gait: decreased stance L LE R rail to go up 4 steps   Therapeutic exercise  SLS with light touch assist PRN R 10x10 seconds  L 10x10 seconds   Standing on Air Ex pad with toe taps onto 12 inch step 10x3each LEno UE support  Stepping over shoe box   R 10x2  L 10x2 Able to clear shoe box 45% of the time  Ascending and descending 4 regular steps without UE assist 4x. Only used R rail 1x for 1 step going up. No UE assist 96% of the time.   Significant improvement in stair negotiation since initial evaluation   Standing hip extension with B UE assist yellow bandaround ankles to promote glute max muscle strength             R 10x3              L 10x3  Standing hip abduction with B UE assist yellow bandaround ankles to promote glute med muscle strength  R 10x3    L 10x3  Stepping over 1 mini hurdle on incline mulch CGA  R 10x  L 10x (min A 2x)  Reviewed plan of care: when finished with PT, pt can transition to a Silver Sneaker type program at the gym or hospital gym.      Improved exercise technique, movement at target joints, use of target muscles after mod verbal, visual, tactile cues.   Clinical impression/pt response: Pt demonstrates significant improvement in stair negotiation since initial evaluation with pt being able to ascend and descend 4 regular steps without UE assist multiple times today. Pt also demonstrates overall improved balance and ability to place and maintain her center of gravity over base of support with different activities. Pt still has difficulty with foot clearance of trailing LE when stepping over obstacles. Pt will benefit from continued skilled physical therapy services to improve strength, balance, and function.      PT Short Term Goals - 12/14/19 1458      PT SHORT TERM GOAL #1   Title Pt will be independent with her HEP to improve balance and decrease fall risk.    Baseline Pt performing her HEP (12/14/2019)    Time 3    Period Weeks    Status Achieved    Target Date 12/17/19             PT Long Term Goals - 02/01/20 1833      PT LONG TERM GOAL #1   Title Patient will improve bilateral hip extension and abduction strength to improve steadiness with gait, and standing tasks, and decrease fall risk.    Time 8    Period Weeks    Status Achieved      PT LONG TERM GOAL #2   Title Patient will improve her DGI balance score to 19/24 or more as a demonstration of improved balance.    Baseline 13/24 DGI (11/02/2019); 19/24 (12/07/2019)    Time 8    Period Weeks    Status Achieved      PT LONG TERM GOAL #3   Title Patient will have a decrease in low back pain to 4/10 or less at  worst to promote ability to peform standing tasks such as  washing dishes or sweeping the floor more comfortably    Baseline 7/10 low back pain at most for the past 3 months (11/02/2019); 2/10 low back pain at most for the past 7 days (12/01/2019), (12/14/2019)    Time 8    Period Weeks    Status Achieved      PT LONG TERM GOAL #4   Title Pt will be able to step over a shoe box and clear both feet independently at least 10x each LE to improve balance and decrease fall risk.    Baseline Unable to clear shoe box when stepping over it (11/02/2019).Able to clear shoe box about 65% of the time. (12/31/2019), (01/28/2020)    Time 6    Period Weeks    Status On-going    Target Date 03/17/20      PT LONG TERM GOAL #5   Title Pt will report decreased to no difficulty with stair negotiation with one UE assist (4 regular steps) to promote ability to get into and out of her house more easily.    Baseline Difficulty with stair negotiation secondary to fear of falling (11/02/2019); difficulty secondary to fear of falling. No difficulty wiht B UE assist. Difficulty when carrying something without UE assist (12/01/2019); Difficulty when performing without UE assist. Able to perform with R rail assist (01/28/2020)    Time 5    Period Weeks    Status Achieved      PT LONG TERM GOAL #6   Title Pt will report decreased to no difficulty with stair negotiation WITHOUT UE assist (4 regular steps) to promote ability to get into and out of her house more easily.    Time 6    Period Weeks    Status On-going    Target Date 03/17/20                 Plan - 03/10/20 1618    Clinical Impression Statement Pt demonstrates significant improvement in stair negotiation since initial evaluation with pt being able to ascend and descend 4 regular steps without UE assist multiple times today. Pt also demonstrates overall improved balance and ability to place and maintain her center of gravity over base of support with different  activities. Pt still has difficulty with foot clearance of trailing LE when stepping over obstacles. Pt will benefit from continued skilled physical therapy services to improve strength, balance, and function.    Personal Factors and Comorbidities Age;Comorbidity 3+;Fitness;Past/Current Experience;Time since onset of injury/illness/exacerbation    Comorbidities B total shoulder, B total knee replacements, low back pain with radiating symptoms, L knee pain, neck pain    Examination-Activity Limitations Lift;Stairs;Carry    Stability/Clinical Decision Making Stable/Uncomplicated    Clinical Decision Making Low    Rehab Potential Fair    PT Frequency 2x / week    PT Duration 6 weeks    PT Treatment/Interventions Therapeutic activities;Gait training;Stair training;Functional mobility training;Therapeutic exercise;Balance training;Neuromuscular re-education;Patient/family education;Manual techniques;Dry needling;Aquatic Therapy;Canalith Repostioning;Electrical Stimulation;Iontophoresis 4mg /ml Dexamethasone;Vestibular    PT Next Visit Plan posture, scapular strengthening, thoracic extension, trunk, glute strengthening, femoral control, balance, manual techniques, modalities PRN    PT Home Exercise Plan Medbridge Access Code    Consulted and Agree with Plan of Care Patient           Patient will benefit from skilled therapeutic intervention in order to improve the following deficits and impairments:  Pain, Postural dysfunction, Improper body mechanics, Difficulty walking, Decreased strength, Decreased balance, Abnormal gait  Visit Diagnosis:  Unsteadiness on feet  Difficulty in walking, not elsewhere classified  Muscle weakness (generalized)  History of falling     Problem List Patient Active Problem List   Diagnosis Date Noted  . Influenza A 08/15/2016    Thank you for your referral.  Loralyn FreshwaterMiguel Vee Bahe PT, DPT   03/10/2020, 5:08 PM  [place] [facility] [address] [place], [place], [address] Phone: [phone]   Fax:  [phone]  Name: Tara Moody MRN: [id number] Date of Birth: 1945-10-23

## 2020-03-14 ENCOUNTER — Other Ambulatory Visit: Payer: Self-pay

## 2020-03-14 ENCOUNTER — Ambulatory Visit: Payer: Medicare Other

## 2020-03-17 ENCOUNTER — Ambulatory Visit: Payer: Medicare Other

## 2020-03-23 ENCOUNTER — Other Ambulatory Visit: Payer: Self-pay

## 2020-03-23 ENCOUNTER — Ambulatory Visit: Payer: Medicare Other

## 2020-03-23 DIAGNOSIS — Z9181 History of falling: Secondary | ICD-10-CM

## 2020-03-23 DIAGNOSIS — R2681 Unsteadiness on feet: Secondary | ICD-10-CM | POA: Diagnosis not present

## 2020-03-23 DIAGNOSIS — M6281 Muscle weakness (generalized): Secondary | ICD-10-CM

## 2020-03-23 DIAGNOSIS — R262 Difficulty in walking, not elsewhere classified: Secondary | ICD-10-CM

## 2020-03-23 NOTE — Therapy (Signed)
[place] [facility] [address], [place], [address] Phone: [phone]   Fax:  [phone]  Physical Therapy Treatment  Patient Details  Name: Tara Moody MRN: [id number] Date of Birth: 04/12/1946 Referring Provider (PT): Dierdre Harness, MD   Encounter Date: 03/23/2020   PT End of Session - 03/23/20 1449    Visit Number 31    Number of Visits 39    Date for PT Re-Evaluation 04/21/20    Authorization Type 1    Authorization Time Period of 10 progress report    PT Start Time 1449    PT Stop Time 1531    PT Time Calculation (min) 42 min    Equipment Utilized During Treatment Gait belt    Activity Tolerance Patient tolerated treatment well    Behavior During Therapy WFL for tasks assessed/performed           Past Medical History:  Diagnosis Date  . Hypertension     Past Surgical History:  Procedure Laterality Date  . CATARACT EXTRACTION    . REPLACEMENT TOTAL KNEE BILATERAL      There were no vitals filed for this visit.   Subjective Assessment - 03/23/20 1450    Subjective Doing pretty good. No pain or discomfort. Balance is not perfect but doing pretty got.  Gets a little off balance usually towards the evenings when making sure she feeds the dogs, etc. Fine when she gets into bed. Was prescribed meclizine and does not know if it works.  [place] 04[place]11[place]2020.    Pertinent History Gait abnormality. Pt has had B knee, B shoulder replacements, B carpal tunnel surgeries. Pt hurt her L knee 2001. 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 Husband had a TBI and pt had take care of him until his death. Has had bouts of her L knee hurting. In the last 3 years, pt has been falling more. Fell 5 times in August 2020. Was outside walking the dogs and has had a hard time picking up her feet (has had that). Feels more tired since having COVID.  Has not fallen in the past 2 months because she  has been paying attention. Fell up the steps which knocked the wind out of her in 09/2019.  Had COVID end of August/beginning September 2020. Wants to prevent falls. Larey Seat one more time in March 2021 when her dogs (jack russel and beagle; pt was walking them at the same time). Pt was on a road with little traffic but it has hills. Pt did not raise her R foot high enough and tripped on a step when pt fell in February 2021. Wonders if her R foot if shorter. When pt fell in August 2020, pt states that she tripped every time. Pt now able to catch herself when she trips. Kneels on her L knee to stand up from a fall. Pt reports tripping on R foot more than the L.  Pt has a house in [place]. Pt currently living in [place]. Planning on returning to [place] in September 2021. [place]: pt lives alone in a 1 story home. 3 steps front door with B rail, 4 steps back door, B rails. [place] home: 1 story home, 1 small step to enter, no rails (not a problem per pt). Same for back door.    Patient Stated Goals Be better able to perform floor to stand transfers.    Currently in Pain?  No/denies    Pain Onset More than a month ago                                     PT Education - 03/23/20 1512    Education Details ther-ex    Person(s) Educated Patient    Methods Explanation;Demonstration;Tactile cues;Verbal cues    Comprehension Returned demonstration;Verbalized understanding              Medbridge Access Code [email or web address]  Therapeutic exercise   Ascending and descending 4 regular steps without UE assist 3x. Able to perform, slight unsteadiness at 3rd rep going up last step with R LE but no LOB  Less difficulty with stair negotiation reported by pt.   Stepping over shoe box    R 10x3  L 10x3  Able to clear shoe box 45% of the time when stepping over with L LE (cues needed for increased R knee flexion to clear trailing foot) on first set, then 60% of the time on second set   SLS  with UE assist PRN   L 10x10 seconds for 2 sets  Reviewed progress with PT towards goals  Reviewed plan of care: continued 2x/week for 4 weeks to obstacle clearance to decrease fall risk  Standing hip abduction with B UE assist yellow bandaround anklesto promote glute med muscle strength             R 10x3               L 10x3   Improved exercise technique, movement at target joints, use of target muscles after min to mod verbal, visual, tactile cues.      Clinical impression/response to treatment Pt demonstrates significant improvement in stair negotiation with pt now currently able to ascend and descend stairs in the clinic without UE assist. Pt also better able to ambulate over even and uneven surfaces and clear obstacles. Still has difficulty clearing an obstacle the size of a shoe box with her trailing LE when stepping over with her L LE. Pt will benefit from continued skilled physical therapy services to address the aforementioned deficit to promote safety as well as to continue improving LE strength, balance and function.       PT Short Term Goals - 12/14/19 1458      PT SHORT TERM GOAL #1   Title Pt will be independent with her HEP to improve balance and decrease fall risk.    Baseline Pt performing her HEP (12/14/2019)    Time 3    Period Weeks    Status Achieved    Target Date 12/17/19             PT Long Term Goals - 03/23/20 1517      PT LONG TERM GOAL #1   Title Patient will improve bilateral hip extension and abduction strength to improve steadiness with gait, and standing tasks, and decrease fall risk.    Time 8    Period Weeks    Status Achieved      PT LONG TERM GOAL #2   Title Patient will improve her DGI balance score to 19/24 or more as a demonstration of improved balance.    Baseline 13/24 DGI (11/02/2019); 19/24 (12/07/2019)    Time 8    Period Weeks    Status Achieved      PT LONG TERM GOAL #3   Title Patient will  have a decrease in low  back pain to 4/10 or less at worst to promote ability to peform standing tasks such as washing dishes or sweeping the floor more comfortably    Baseline 7/10 low back pain at most for the past 3 months (11/02/2019); 2/10 low back pain at most for the past 7 days (12/01/2019), (12/14/2019)    Time 8    Period Weeks    Status Achieved      PT LONG TERM GOAL #4   Title Pt will be able to step over a shoe box and clear both feet independently at least 10x each LE to improve balance and decrease fall risk.    Baseline Unable to clear shoe box when stepping over it (11/02/2019).Able to clear shoe box about 65% of the time. (12/31/2019), (01/28/2020); 60% of the time for L LE, 95 % of time foot clearance R LE (03/23/2020)    Time 4    Period Weeks    Status On-going    Target Date 04/21/20      PT LONG TERM GOAL #5   Title Pt will report decreased to no difficulty with stair negotiation with one UE assist (4 regular steps) to promote ability to get into and out of her house more easily.    Baseline Difficulty with stair negotiation secondary to fear of falling (11/02/2019); difficulty secondary to fear of falling. No difficulty wiht B UE assist. Difficulty when carrying something without UE assist (12/01/2019); Difficulty when performing without UE assist. Able to perform with R rail assist (01/28/2020)    Time 5    Period Weeks    Status Achieved      PT LONG TERM GOAL #6   Title Pt will report decreased to no difficulty with stair negotiation WITHOUT UE assist (4 regular steps) to promote ability to get into and out of her house more easily.    Baseline Able to ascend and desecent 4 regular stairs 3x without UE assist. Pt states decreased difficulty wiht stair negotiation (03/23/2020)    Time 6    Period Weeks    Status Achieved    Target Date 03/17/20                 Plan - 03/23/20 1513    Clinical Impression Statement Pt demonstrates significant improvement in stair negotiation with pt now  currently able to ascend and descend stairs in the clinic without UE assist. Pt also better able to ambulate over even and uneven surfaces and clear obstacles. Still has difficulty clearing an obstacle the size of a shoe box with her trailing LE when stepping over with her L LE. Pt will benefit from continued skilled physical therapy services to address the aforementioned deficit to promote safety as well as to continue improving LE strength, balance and function.    Personal Factors and Comorbidities Age;Comorbidity 3+;Fitness;Past/Current Experience;Time since onset of injury/illness/exacerbation    Comorbidities B total shoulder, B total knee replacements, low back pain with radiating symptoms, L knee pain, neck pain    Examination-Activity Limitations Lift;Stairs;Carry    Stability/Clinical Decision Making Stable/Uncomplicated    Clinical Decision Making Low    Rehab Potential Fair    PT Frequency 2x / week    PT Duration 4 weeks    PT Treatment/Interventions Therapeutic activities;Gait training;Stair training;Functional mobility training;Therapeutic exercise;Balance training;Neuromuscular re-education;Patient/family education;Manual techniques;Dry needling;Aquatic Therapy;Canalith Repostioning;Electrical Stimulation;Iontophoresis 4mg /ml Dexamethasone;Vestibular    PT Next Visit Plan posture, scapular strengthening, thoracic extension, trunk, glute strengthening, femoral  control, balance, manual techniques, modalities PRN    PT Home Exercise Plan Medbridge Access Code [email or web address]    Consulted and Agree with Plan of Care Patient           Patient will benefit from skilled therapeutic intervention in order to improve the following deficits and impairments:  Pain, Postural dysfunction, Improper body mechanics, Difficulty walking, Decreased strength, Decreased balance, Abnormal gait  Visit Diagnosis: Unsteadiness on feet - Plan: PT plan of care cert/re-cert  Difficulty in walking, not elsewhere  classified - Plan: PT plan of care cert/re-cert  Muscle weakness (generalized) - Plan: PT plan of care cert/re-cert  History of falling - Plan: PT plan of care cert/re-cert     Problem List Patient Active Problem List   Diagnosis Date Noted  . Influenza A 08/15/2016    Loralyn Freshwater PT, DPT   03/23/2020, 3:54 PM  [place] [facility] PHYS[facility] AND SPORTS MEDICINE [address] [place], [address] Phone: [phone]   Fax:  [phone]  Name: Tara Moody MRN: [id number] Date of Birth: [birth year]

## 2020-03-29 ENCOUNTER — Other Ambulatory Visit: Payer: Self-pay

## 2020-03-29 ENCOUNTER — Ambulatory Visit: Payer: Medicare Other

## 2020-03-29 DIAGNOSIS — Z9181 History of falling: Secondary | ICD-10-CM

## 2020-03-29 DIAGNOSIS — M6281 Muscle weakness (generalized): Secondary | ICD-10-CM

## 2020-03-29 DIAGNOSIS — R2681 Unsteadiness on feet: Secondary | ICD-10-CM | POA: Diagnosis not present

## 2020-03-29 DIAGNOSIS — R262 Difficulty in walking, not elsewhere classified: Secondary | ICD-10-CM

## 2020-03-29 NOTE — Therapy (Signed)
[place] [facility] [address] [place], [address] Phone: [phone]   Fax:  [phone]  Physical Therapy Treatment  Patient Details  Name: Tara Moody MRN: [id number] Date of Birth: [birth year] Referring Provider (PT): Dierdre Harness, MD   Encounter Date: 03/29/2020   PT End of Session - 03/29/20 1547    Visit Number 32    Number of Visits 39    Date for PT Re-Evaluation 04/21/20    Authorization Type 2    Authorization Time Period of 10 progress report    PT Start Time 1548    PT Stop Time 1633    PT Time Calculation (min) 45 min    Equipment Utilized During Treatment Gait belt    Activity Tolerance Patient tolerated treatment well    Behavior During Therapy [facility] for tasks assessed/performed           Past Medical History:  Diagnosis Date  . Hypertension     Past Surgical History:  Procedure Laterality Date  . CATARACT EXTRACTION    . REPLACEMENT TOTAL KNEE BILATERAL      There were no vitals filed for this visit.   Subjective Assessment - 03/29/20 1548    Subjective Doing ok, no pain. Walking is ok. Pt able to walk up to 2 miles now. Going to be out of town for a couple of weeks, going to [employer].    Pertinent History Gait abnormality. Pt has had B knee, B shoulder replacements, B carpal tunnel surgeries. Pt hurt her L knee 2001. 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 Husband had a TBI and pt had take care of him until his death. Has had bouts of her L knee hurting. In the last 3 years, pt has been falling more. Fell 5 times in August 2020. Was outside walking the dogs and has had a hard time picking up her feet (has had that). Feels more tired since having COVID.  Has not fallen in the past 2 months because she has been paying attention. Fell up the steps which knocked the wind out of her in 09/2019.  Had COVID end of August/beginning September 2020. Wants to prevent falls. Larey Seat one more  time in March 2021 when her dogs (jack russel and beagle; pt was walking them at the same time). Pt was on a road with little traffic but it has hills. Pt did not raise her R foot high enough and tripped on a step when pt fell in February 2021. Wonders if her R foot if shorter. When pt fell in August 2020, pt states that she tripped every time. Pt now able to catch herself when she trips. Kneels on her L knee to stand up from a fall. Pt reports tripping on R foot more than the L.  Pt has a house in [place]. Pt currently living in [place]. Planning on returning to [place] in September 2021. [place]: pt lives alone in a 1 story home. 3 steps front door with B rail, 4 steps back door, B rails. [place] home: 1 story home, 1 small step to enter, no rails (not a problem per pt). Same for back door.    Patient Stated Goals Be better able to perform floor to stand transfers.    Currently in Pain? No/denies    Pain Onset More than a month ago  PT Education - 03/29/20 1611    Education Details ther-ex    Person(s) Educated Patient    Methods Explanation;Demonstration;Tactile cues;Verbal cues    Comprehension Returned demonstration;Verbalized understanding          Medbridge Access Code [email or web address]  Therapeutic exercise  SLS with UE assist PRN              L 10x10 seconds for 2 sets   Standing R hip flexion without UE assist 10x3 to promote foot clearance over obstacles   Standing hip abduction with B UE assist red bandaround anklesto promote glute med muscle strength R 5x3 L 5x3  Stepping over shoe box               R 10x3             L 10x3            improving overall foot clearance when stepping over with L LE followed by R LE observed  Ascending and descending 4 regular steps without UE assist but holding 2 lbs each hand to promote ability to carry groceries up and down steps with less difficulty.    3x. Able to perform first 2 reps but 3rd., LOB x 1 to the R secondary to backwards lean  Performed 3x again. Same LOB during 3rd set.    Reviewed HEP. Pt demonstrated and verbalized understanding.   Improved exercise technique, movement at target joints, use of target muscles after min to mod verbal, visual, tactile cues.      Clinical impression/response to treatment Continued working on glute med closed chain strength and improving ability to shift body weight onto stance foot for balance and ability to clear obstacles when stepping over them. Improving ability to clear trailing foot when stepping over shoe box. Pt also improving gait distance when ambulating outside of PT based on subjective reports. Pt tolerated session well without aggravation of symptoms. Pt will benefit from continued skilled physical therapy services to improve strength, balance, and function.           PT Short Term Goals - 12/14/19 1458      PT SHORT TERM GOAL #1   Title Pt will be independent with her HEP to improve balance and decrease fall risk.    Baseline Pt performing her HEP (12/14/2019)    Time 3    Period Weeks    Status Achieved    Target Date 12/17/19             PT Long Term Goals - 03/23/20 1517      PT LONG TERM GOAL #1   Title Patient will improve bilateral hip extension and abduction strength to improve steadiness with gait, and standing tasks, and decrease fall risk.    Time 8    Period Weeks    Status Achieved      PT LONG TERM GOAL #2   Title Patient will improve her DGI balance score to 19/24 or more as a demonstration of improved balance.    Baseline 13/24 DGI (11/02/2019); 19/24 (12/07/2019)    Time 8    Period Weeks    Status Achieved      PT LONG TERM GOAL #3   Title Patient will have a decrease in low back pain to 4/10 or less at worst to promote ability to peform standing tasks such as washing dishes or sweeping the floor more comfortably    Baseline 7/10  low back pain at most for  the past 3 months (11/02/2019); 2/10 low back pain at most for the past 7 days (12/01/2019), (12/14/2019)    Time 8    Period Weeks    Status Achieved      PT LONG TERM GOAL #4   Title Pt will be able to step over a shoe box and clear both feet independently at least 10x each LE to improve balance and decrease fall risk.    Baseline Unable to clear shoe box when stepping over it (11/02/2019).Able to clear shoe box about 65% of the time. (12/31/2019), (01/28/2020); 60% of the time for L LE, 95 % of time foot clearance R LE (03/23/2020)    Time 4    Period Weeks    Status On-going    Target Date 04/21/20      PT LONG TERM GOAL #5   Title Pt will report decreased to no difficulty with stair negotiation with one UE assist (4 regular steps) to promote ability to get into and out of her house more easily.    Baseline Difficulty with stair negotiation secondary to fear of falling (11/02/2019); difficulty secondary to fear of falling. No difficulty wiht B UE assist. Difficulty when carrying something without UE assist (12/01/2019); Difficulty when performing without UE assist. Able to perform with R rail assist (01/28/2020)    Time 5    Period Weeks    Status Achieved      PT LONG TERM GOAL #6   Title Pt will report decreased to no difficulty with stair negotiation WITHOUT UE assist (4 regular steps) to promote ability to get into and out of her house more easily.    Baseline Able to ascend and desecent 4 regular stairs 3x without UE assist. Pt states decreased difficulty wiht stair negotiation (03/23/2020)    Time 6    Period Weeks    Status Achieved    Target Date 03/17/20                 Plan - 03/29/20 1611    Clinical Impression Statement Continued working on glute med closed chain strength and improving ability to shift body weight onto stance foot for balance and ability to clear obstacles when stepping over them. Improving ability to clear trailing foot when  stepping over shoe box. Pt also improving gait distance when ambulating outside of PT based on subjective reports. Pt tolerated session well without aggravation of symptoms. Pt will benefit from continued skilled physical therapy services to improve strength, balance, and function.    Personal Factors and Comorbidities Age;Comorbidity 3+;Fitness;Past/Current Experience;Time since onset of injury/illness/exacerbation    Comorbidities B total shoulder, B total knee replacements, low back pain with radiating symptoms, L knee pain, neck pain    Examination-Activity Limitations Lift;Stairs;Carry    Stability/Clinical Decision Making Stable/Uncomplicated    Rehab Potential Fair    PT Frequency 2x / week    PT Duration 4 weeks    PT Treatment/Interventions Therapeutic activities;Gait training;Stair training;Functional mobility training;Therapeutic exercise;Balance training;Neuromuscular re-education;Patient/family education;Manual techniques;Dry needling;Aquatic Therapy;Canalith Repostioning;Electrical Stimulation;Iontophoresis 4mg /ml Dexamethasone;Vestibular    PT Next Visit Plan posture, scapular strengthening, thoracic extension, trunk, glute strengthening, femoral control, balance, manual techniques, modalities PRN    PT Home Exercise Plan Medbridge Access Code    Consulted and Agree with Plan of Care Patient           Patient will benefit from skilled therapeutic intervention in order to improve the following deficits and impairments:  Pain, Postural dysfunction, Improper body  mechanics, Difficulty walking, Decreased strength, Decreased balance, Abnormal gait  Visit Diagnosis: Unsteadiness on feet  Difficulty in walking, not elsewhere classified  Muscle weakness (generalized)  History of falling     Problem List Patient Active Problem List   Diagnosis Date Noted  . Influenza A 08/15/2016    Loralyn FreshwaterMiguel Kanin Lia PT, DPT   03/29/2020, 4:44 PM  [facility] [address] [place], [place], [address] Phone: [phone]   Fax:  [phone]  Name: Elmon ElseBarbara Calzadilla MRN: [id number] Date of Birth: Jun 25, 1946

## 2020-03-29 NOTE — Patient Instructions (Signed)
Access Code: [email or web address] URL: [email or web address] Date: 03/29/2020 Prepared by: Loralyn Freshwater  Exercises Hooklying Clamshell with Resistance - 1 x daily - 7 x weekly - 3 sets - 10 reps Supine Posterior Pelvic Tilt - 1 x daily - 7 x weekly - 3 sets - 10 reps - 5 seconds hold Seated Cervical Retraction - 1 x daily - 7 x weekly - 3 sets - 10 reps - 5 hold Standing Hip Abduction with Counter Support - 1 x daily - 7 x weekly - 3 sets - 10 reps - 5 seconds hold Standing Single Leg Stance with Counter Support - 1 x daily - 7 x weekly - 3 sets - 10 reps - 10 seconds hold Standing Marching - 1 x daily - 7 x weekly - 3 sets - 10 reps

## 2020-04-12 ENCOUNTER — Ambulatory Visit: Payer: Medicare Other | Attending: Infectious Diseases

## 2020-04-12 DIAGNOSIS — Z9181 History of falling: Secondary | ICD-10-CM | POA: Insufficient documentation

## 2020-04-12 DIAGNOSIS — R2681 Unsteadiness on feet: Secondary | ICD-10-CM | POA: Insufficient documentation

## 2020-04-12 DIAGNOSIS — M6281 Muscle weakness (generalized): Secondary | ICD-10-CM | POA: Insufficient documentation

## 2020-04-12 DIAGNOSIS — R262 Difficulty in walking, not elsewhere classified: Secondary | ICD-10-CM | POA: Insufficient documentation

## 2020-04-14 ENCOUNTER — Telehealth: Payer: Self-pay

## 2020-04-14 ENCOUNTER — Ambulatory Visit: Payer: Medicare Other

## 2020-04-14 NOTE — Telephone Encounter (Signed)
No show. Called patient and left a message pertaining to appointment and a reminder for the next follow up session. Return phone call requested. Phone number ([phone]) provided.   

## 2020-04-20 ENCOUNTER — Ambulatory Visit: Payer: Medicare Other

## 2020-04-20 ENCOUNTER — Other Ambulatory Visit: Payer: Self-pay

## 2020-04-20 DIAGNOSIS — R262 Difficulty in walking, not elsewhere classified: Secondary | ICD-10-CM

## 2020-04-20 DIAGNOSIS — M6281 Muscle weakness (generalized): Secondary | ICD-10-CM | POA: Diagnosis present

## 2020-04-20 DIAGNOSIS — R2681 Unsteadiness on feet: Secondary | ICD-10-CM

## 2020-04-20 DIAGNOSIS — Z9181 History of falling: Secondary | ICD-10-CM

## 2020-04-20 NOTE — Therapy (Addendum)
[place] [facility] [address] [place], [address] Phone: [phone]   Fax:  [phone]  Physical Therapy Treatment  Patient Details  Name: Tara Moody MRN: [id number] Date of Birth: [birth year] Referring Provider (PT): Dierdre Harness, MD   Encounter Date: 04/20/2020   PT End of Session - 04/20/20 0908    Visit Number 33    Number of Visits 47    Date for PT Re-Evaluation 05/19/20    Authorization Type 3    Authorization Time Period of 10 progress report    PT Start Time 0907    PT Stop Time 0932    PT Time Calculation (min) 25 min    Equipment Utilized During Treatment Gait belt    Activity Tolerance Patient tolerated treatment well    Behavior During Therapy [facility] for tasks assessed/performed           Past Medical History:  Diagnosis Date  . Hypertension     Past Surgical History:  Procedure Laterality Date  . CATARACT EXTRACTION    . REPLACEMENT TOTAL KNEE BILATERAL      There were no vitals filed for this visit.   Subjective Assessment - 04/20/20 0909    Subjective Was able [place] get around better at the beach. Able to walk on the sand (not as lose, but lose from the step down to the beach). Was able to walk on sand all by herself.  Pt however fell while getting out of the hot tub going down 2 steps but did not pay attention. Feels like she would not have fallen if she looked on the step. No pain or discomfort. Did no hurt herself when she fell off while she was going down 2 steps at the hot tub. Balance is better overall. The people she went with said that pt is getting around much better.  Was fine after the fall and did not miss a beat. Went back to what she was doing.    Pertinent History Gait abnormality. Pt has had B knee, B shoulder replacements, B carpal tunnel surgeries. Pt hurt her L knee 2001. 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 Husband had a TBI and pt had take care of him until his  death. Has had bouts of her L knee hurting. In the last 3 years, pt has been falling more. Fell 5 times in August 2020. Was outside walking the dogs and has had a hard time picking up her feet (has had that). Feels more tired since having COVID.  Has not fallen in the past 2 months because she has been paying attention. Fell up the steps which knocked the wind out of her in 09/2019.  Had COVID end of August/beginning September 2020. Wants to prevent falls. Larey Seat one more time in March 2021 when her dogs (jack russel and beagle; pt was walking them at the same time). Pt was on a road with little traffic but it has hills. Pt did not raise her R foot high enough and tripped on a step when pt fell in February 2021. Wonders if her R foot if shorter. When pt fell in August 2020, pt states that she tripped every time. Pt now able to catch herself when she trips. Kneels on her L knee to stand up from a fall. Pt reports tripping on R foot more than the L.  Pt has a house in [place]. Pt currently living in [place]. Planning on returning to  [place] in September 2021. [place]: pt lives alone in a 1 story home. 3 steps front door with B rail, 4 steps back door, B rails. [place] home: 1 story home, 1 small step to enter, no rails (not a problem per pt). Same for back door.    Patient Stated Goals Be better able to perform floor to stand transfers.    Currently in Pain? No/denies    Pain Score 0-No pain    Pain Onset More than a month ago                                     PT Education - 04/20/20 0914    Education Details ther-ex    Person(s) Educated Patient    Methods Explanation;Demonstration;Tactile cues;Verbal cues    Comprehension Verbalized understanding;Returned demonstration             MedbridgeAccess Code [email or web address]  Therapeutic exercise  SLS with UE assist PRN  L 10x10 seconds   R 10x10 seconds  reviewed POC: do 2x/week for 4 weeks secondary to  recent slip/fall when stepping down the 2 steps    Ascending and descending 4 regular steps without UE assist 3x  Then holding 2 lbs each hand to promote ability to carry groceries up and down steps with less difficulty, light touch assist only as needed 3x  Stepping over obstacle as high as a shoe box with R LE 1x,then L LE 10x2  Cues for weight shifting forward onto stance foot to be able to clear trailing foot   Improved foot clearance after cues  Improved exercise technique, movement at target joints, use of target muscles after min to mod verbal, visual, tactile cues.  Clinical impression/response to treatment Pt arrived late so session was adjusted accordingly. Pt demonstrates overall improved balance, strength, low back pain level since initial evaluation and currently able to ambulate on uneven surfaces such as sand at her recent beach trip as well as negotiate stairs with less difficulty. Pt also demonstrates overall improved ability to clear obstacles when stepping over them with when focusing on proper weight shifting and placing and maintaining her center of gravity over her base of support. Pt making very good progress with PT towards goals. Pt however reports a fall while stepping down a step getting out of a hot tub which she attributes to not looking down at where she is walking. Pt reports that she would not have fallen if she did. Pt also reports being able to get up independently afterwards. Due to recent fall, pt will benefit from continued skilled physical therapy services to continue to improve strength, balance and decrease fall risk for safety.       PT Short Term Goals - 12/14/19 1458      PT SHORT TERM GOAL #1   Title Pt will be independent with her HEP to improve balance and decrease fall risk.    Baseline Pt performing her HEP (12/14/2019)    Time 3    Period Weeks    Status Achieved    Target Date 12/17/19             PT Long Term Goals - 04/20/20 1243       PT LONG TERM GOAL #1   Title Patient will improve bilateral hip extension and abduction strength to improve steadiness with gait, and standing tasks, and decrease fall risk.    Time  8    Period Weeks    Status Achieved      PT LONG TERM GOAL #2   Title Patient will improve her DGI balance score to 19/24 or more as a demonstration of improved balance.    Baseline 13/24 DGI (11/02/2019); 19/24 (12/07/2019)    Time 8    Period Weeks    Status Achieved      PT LONG TERM GOAL #3   Title Patient will have a decrease in low back pain to 4/10 or less at worst to promote ability to peform standing tasks such as washing dishes or sweeping the floor more comfortably    Baseline 7/10 low back pain at most for the past 3 months (11/02/2019); 2/10 low back pain at most for the past 7 days (12/01/2019), (12/14/2019)    Time 8    Period Weeks    Status Achieved      PT LONG TERM GOAL #4   Title Pt will be able to step over a shoe box and clear both feet independently at least 10x each LE to improve balance and decrease fall risk.    Baseline Unable to clear shoe box when stepping over it (11/02/2019).Able to clear shoe box about 65% of the time. (12/31/2019), (01/28/2020); 60% of the time for L LE, 95 % of time foot clearance R LE (03/23/2020), (04/20/2020)    Time 4    Period Weeks    Status On-going    Target Date 05/19/20      PT LONG TERM GOAL #5   Title Pt will report decreased to no difficulty with stair negotiation with one UE assist (4 regular steps) to promote ability to get into and out of her house more easily.    Baseline Difficulty with stair negotiation secondary to fear of falling (11/02/2019); difficulty secondary to fear of falling. No difficulty wiht B UE assist. Difficulty when carrying something without UE assist (12/01/2019); Difficulty when performing without UE assist. Able to perform with R rail assist (01/28/2020)    Time 5    Period Weeks    Status Achieved      PT LONG TERM GOAL  #6   Title Pt will report decreased to no difficulty with stair negotiation WITHOUT UE assist (4 regular steps) to promote ability to get into and out of her house more easily.    Baseline Able to ascend and desecent 4 regular stairs 3x without UE assist. Pt states decreased difficulty wiht stair negotiation (03/23/2020)    Time 6    Period Weeks    Status Achieved                 Plan - 04/20/20 1246    Clinical Impression Statement Pt arrived late so session was adjusted accordingly. Pt demonstrates overall improved balance, strength, low back pain level since initial evaluation and currently able to ambulate on uneven surfaces such as sand at her recent beach trip as well as negotiate stairs with less difficulty. Pt also demonstrates overall improved ability to clear obstacles when stepping over them with when focusing on proper weight shifting and placing and maintaining her center of gravity over her base of support. Pt making very good progress with PT towards goals. Pt however reports a fall while stepping down a step getting out of a hot tub which she attributes to not looking down at where she is walking. Pt reports that she would not have fallen if she did. Pt also reports  being able to get up independently afterwards. Due to recent fall, pt will benefit from continued skilled physical therapy services to continue to improve strength, balance and decrease fall risk for safety.    Personal Factors and Comorbidities Age;Comorbidity 3+;Fitness;Past/Current Experience;Time since onset of injury/illness/exacerbation    Comorbidities B total shoulder, B total knee replacements, low back pain with radiating symptoms, L knee pain, neck pain    Examination-Activity Limitations Lift;Stairs;Carry    Stability/Clinical Decision Making Stable/Uncomplicated    Rehab Potential Fair    PT Frequency 2x / week    PT Duration 4 weeks    PT Treatment/Interventions Therapeutic activities;Gait  training;Stair training;Functional mobility training;Therapeutic exercise;Balance training;Neuromuscular re-education;Patient/family education;Manual techniques;Dry needling;Aquatic Therapy;Canalith Repostioning;Electrical Stimulation;Iontophoresis 4mg /ml Dexamethasone;Vestibular    PT Next Visit Plan posture, scapular strengthening, thoracic extension, trunk, glute strengthening, femoral control, balance, manual techniques, modalities PRN    PT Home Exercise Plan Medbridge Access Code [email or web address]    Consulted and Agree with Plan of Care Patient           Patient will benefit from skilled therapeutic intervention in order to improve the following deficits and impairments:  Pain, Postural dysfunction, Improper body mechanics, Difficulty walking, Decreased strength, Decreased balance, Abnormal gait  Visit Diagnosis: Unsteadiness on feet - Plan: PT plan of care cert/re-cert  Difficulty in walking, not elsewhere classified - Plan: PT plan of care cert/re-cert  Muscle weakness (generalized) - Plan: PT plan of care cert/re-cert  History of falling - Plan: PT plan of care cert/re-cert     Problem List Patient Active Problem List   Diagnosis Date Noted  . Influenza A 08/15/2016    Loralyn FreshwaterMiguel Priseis Cratty PT, DPT    04/20/2020, 1:01 PM  [place] [facility] [address] [place], [place], [address] Phone: [phone]   Fax:  [phone]  Name: Tara Moody MRN: [id number] Date of Birth: [birth year]

## 2020-04-20 NOTE — Addendum Note (Signed)
Addended by: Charlene Brooke on: 04/20/2020 01:01 PM   Modules accepted: Orders

## 2020-04-26 ENCOUNTER — Other Ambulatory Visit: Payer: Self-pay

## 2020-04-26 ENCOUNTER — Ambulatory Visit: Payer: Medicare Other

## 2020-04-26 DIAGNOSIS — Z9181 History of falling: Secondary | ICD-10-CM

## 2020-04-26 DIAGNOSIS — R2681 Unsteadiness on feet: Secondary | ICD-10-CM | POA: Diagnosis not present

## 2020-04-26 DIAGNOSIS — R262 Difficulty in walking, not elsewhere classified: Secondary | ICD-10-CM

## 2020-04-26 DIAGNOSIS — M6281 Muscle weakness (generalized): Secondary | ICD-10-CM

## 2020-04-26 NOTE — Therapy (Signed)
[facility] [address] [place], [address] Phone: [phone]   Fax:  [phone]  Physical Therapy Treatment  Patient Details  Name: Tara Moody MRN: [id number] Date of Birth: Dec 13, 1945 Referring Provider (PT): Dierdre Harness, MD   Encounter Date: 04/26/2020   PT End of Session - 04/26/20 1400    Visit Number 34    Number of Visits 47    Date for PT Re-Evaluation 05/19/20    Authorization Type 4    Authorization Time Period of 10 progress report    PT Start Time 1400   Pt arrived late   PT Stop Time 1427    PT Time Calculation (min) 27 min    Equipment Utilized During Treatment Gait belt    Activity Tolerance Patient tolerated treatment well    Behavior During Therapy WFL for tasks assessed/performed           Past Medical History:  Diagnosis Date   Hypertension     Past Surgical History:  Procedure Laterality Date   CATARACT EXTRACTION     REPLACEMENT TOTAL KNEE BILATERAL      There were no vitals filed for this visit.   Subjective Assessment - 04/26/20 1402    Subjective Balance is good, no pain.    Pertinent History Gait abnormality. Pt has had B knee, B shoulder replacements, B carpal tunnel surgeries. Pt hurt her L knee 2001. 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 Husband had a TBI and pt had take care of him until his death. Has had bouts of her L knee hurting. In the last 3 years, pt has been falling more. Fell 5 times in August 2020. Was outside walking the dogs and has had a hard time picking up her feet (has had that). Feels more tired since having COVID.  Has not fallen in the past 2 months because she has been paying attention. Fell up the steps which knocked the wind out of her in 09/2019.  Had COVID end of August/beginning September 2020. Wants to prevent falls. Larey Seat one more time in March 2021 when her dogs (jack russel and beagle; pt was walking them at the same time). Pt  was on a road with little traffic but it has hills. Pt did not raise her R foot high enough and tripped on a step when pt fell in February 2021. Wonders if her R foot if shorter. When pt fell in August 2020, pt states that she tripped every time. Pt now able to catch herself when she trips. Kneels on her L knee to stand up from a fall. Pt reports tripping on R foot more than the L.  [place] has a house in [place]. Pt currently living in [place]. Planning on returning to [place] in September 2021. [place]: pt lives alone in a 1 story home. 3 steps front door with B rail, 4 steps back door, B rails. [place] home: 1 story home, 1 small step to enter, no rails (not a problem per pt). Same for back door.    Patient Stated Goals Be better able to perform floor to stand transfers.    Currently in Pain? No/denies    Pain Onset More than a month ago                                     PT Education -  04/26/20 1411    Education Details ther-ex    Person(s) Educated Patient    Methods Explanation;Demonstration;Tactile cues;Verbal cues    Comprehension Returned demonstration;Verbalized understanding          MedbridgeAccess Code [email or web address]  Therapeutic exercise  SLS with UE assist PRN  L 10x10 seconds             R 10x10 seconds  Static lunges with one UE assist   R 10x  L 10x  Lateral step downs with B UE assist   R 5x3  L 5x3  Ascending and descending 4 regular steps without UE assist 3x             Then holding 2 lbs each hand to promote ability to carry groceries up and down steps with less difficulty 3x    light touch assist 1x  only as needed  Standing hip abduction with B UE assist, yellow band around ankles to promote glute med muscle strengthening.   R 10x3  L 10x3   Improved exercise technique, movement at target joints, use of target muscles after min to mod verbal, visual, tactile cues.     Clinical impression/response to  treatment Pt arrived late so session was adjusted accordingly. Improving overall ability to negotiate stairs without UE assist. Continued working on improving glute and quad strength to continue progress and decrease fall risk. Pt will benefit from continued skilled physical therapy services to improve strength, balance and function.,          PT Short Term Goals - 12/14/19 1458      PT SHORT TERM GOAL #1   Title Pt will be independent with her HEP to improve balance and decrease fall risk.    Baseline Pt performing her HEP (12/14/2019)    Time 3    Period Weeks    Status Achieved    Target Date 12/17/19             PT Long Term Goals - 04/20/20 1243      PT LONG TERM GOAL #1   Title Patient will improve bilateral hip extension and abduction strength to improve steadiness with gait, and standing tasks, and decrease fall risk.    Time 8    Period Weeks    Status Achieved      PT LONG TERM GOAL #2   Title Patient will improve her DGI balance score to 19/24 or more as a demonstration of improved balance.    Baseline 13/24 DGI (11/02/2019); 19/24 (12/07/2019)    Time 8    Period Weeks    Status Achieved      PT LONG TERM GOAL #3   Title Patient will have a decrease in low back pain to 4/10 or less at worst to promote ability to peform standing tasks such as washing dishes or sweeping the floor more comfortably    Baseline 7/10 low back pain at most for the past 3 months (11/02/2019); 2/10 low back pain at most for the past 7 days (12/01/2019), (12/14/2019)    Time 8    Period Weeks    Status Achieved      PT LONG TERM GOAL #4   Title Pt will be able to step over a shoe box and clear both feet independently at least 10x each LE to improve balance and decrease fall risk.    Baseline Unable to clear shoe box when stepping over it (11/02/2019).Able to clear shoe box about 65% of the time. (  12/31/2019), (01/28/2020); 60% of the time for L LE, 95 % of time foot clearance R LE (03/23/2020),  (04/20/2020)    Time 4    Period Weeks    Status On-going    Target Date 05/19/20      PT LONG TERM GOAL #5   Title Pt will report decreased to no difficulty with stair negotiation with one UE assist (4 regular steps) to promote ability to get into and out of her house more easily.    Baseline Difficulty with stair negotiation secondary to fear of falling (11/02/2019); difficulty secondary to fear of falling. No difficulty wiht B UE assist. Difficulty when carrying something without UE assist (12/01/2019); Difficulty when performing without UE assist. Able to perform with R rail assist (01/28/2020)    Time 5    Period Weeks    Status Achieved      PT LONG TERM GOAL #6   Title Pt will report decreased to no difficulty with stair negotiation WITHOUT UE assist (4 regular steps) to promote ability to get into and out of her house more easily.    Baseline Able to ascend and desecent 4 regular stairs 3x without UE assist. Pt states decreased difficulty wiht stair negotiation (03/23/2020)    Time 6    Period Weeks    Status Achieved                 Plan - 04/26/20 1417    Clinical Impression Statement Pt arrived late so session was adjusted accordingly. Improving overall ability to negotiate stairs without UE assist. Continued working on improving glute and quad strength to continue progress and decrease fall risk. Pt will benefit from continued skilled physical therapy services to improve strength, balance and function.,    Personal Factors and Comorbidities Age;Comorbidity 3+;Fitness;Past/Current Experience;Time since onset of injury/illness/exacerbation    Comorbidities B total shoulder, B total knee replacements, low back pain with radiating symptoms, L knee pain, neck pain    Examination-Activity Limitations Lift;Stairs;Carry    Stability/Clinical Decision Making Stable/Uncomplicated    Rehab Potential Fair    PT Frequency 2x / week    PT Duration 4 weeks    PT Treatment/Interventions  Therapeutic activities;Gait training;Stair training;Functional mobility training;Therapeutic exercise;Balance training;Neuromuscular re-education;Patient/family education;Manual techniques;Dry needling;Aquatic Therapy;Canalith Repostioning;Electrical Stimulation;Iontophoresis 4mg /ml Dexamethasone;Vestibular    PT Next Visit Plan posture, scapular strengthening, thoracic extension, trunk, glute strengthening, femoral control, balance, manual techniques, modalities PRN    PT Home Exercise Plan Medbridge Access Code    Consulted and Agree with Plan of Care Patient           Patient will benefit from skilled therapeutic intervention in order to improve the following deficits and impairments:  Pain, Postural dysfunction, Improper body mechanics, Difficulty walking, Decreased strength, Decreased balance, Abnormal gait  Visit Diagnosis: Unsteadiness on feet  Difficulty in walking, not elsewhere classified  Muscle weakness (generalized)  History of falling     Problem List Patient Active Problem List   Diagnosis Date Noted   Influenza A 08/15/2016    10/13/2016 PT, DPT   04/26/2020, 4:35 PM  [facility] [address], [place] Phone: [phone]   Fax:  [phone]  Name: Tara Moody MRN: [id number] Else Date of Birth: [birth year]

## 2020-04-28 ENCOUNTER — Ambulatory Visit: Payer: Medicare Other

## 2020-04-28 ENCOUNTER — Other Ambulatory Visit: Payer: Self-pay

## 2020-04-28 DIAGNOSIS — R2681 Unsteadiness on feet: Secondary | ICD-10-CM | POA: Diagnosis not present

## 2020-04-28 DIAGNOSIS — Z9181 History of falling: Secondary | ICD-10-CM

## 2020-04-28 DIAGNOSIS — R262 Difficulty in walking, not elsewhere classified: Secondary | ICD-10-CM

## 2020-04-28 DIAGNOSIS — M6281 Muscle weakness (generalized): Secondary | ICD-10-CM

## 2020-04-28 NOTE — Therapy (Signed)
[facility] [address] [place], [place], [address] Phone: [phone]   Fax:  [phone]  Physical Therapy Treatment  Patient Details  Name: Tara Moody MRN: [id number] Date of Birth: 08/06/1946 Referring Provider (PT): Dierdre Harnessavid Patrick Fitzgerald, MD   Encounter Date: 04/28/2020   PT End of Session - 04/28/20 1135    Visit Number 35    Number of Visits 47    Date for PT Re-Evaluation 05/19/20    Authorization Type 5    Authorization Time Period of 10 progress report    PT Start Time 1135    PT Stop Time 1200    PT Time Calculation (min) 25 min    Equipment Utilized During Treatment Gait belt    Activity Tolerance Patient tolerated treatment well    Behavior During Therapy [facility] for tasks assessed/performed           Past Medical History:  Diagnosis Date  . Hypertension     Past Surgical History:  Procedure Laterality Date  . CATARACT EXTRACTION    . REPLACEMENT TOTAL KNEE BILATERAL      There were no vitals filed for this visit.   Subjective Assessment - 04/28/20 1136    Subjective Doing good today    Pertinent History Gait abnormality. Pt has had B knee, B shoulder replacements, B carpal tunnel surgeries. Pt hurt her L knee 2001. 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 Husband had a TBI and pt had take care of him until his death. Has had bouts of her L knee hurting. In the last 3 years, pt has been falling more. Fell 5 times in August 2020. Was outside walking the dogs and has had a hard time picking up her feet (has had that). Feels more tired since having COVID.  Has not fallen in the past 2 months because she has been paying attention. Fell up the steps which knocked the wind out of her in 09/2019.  Had COVID end of August/beginning September 2020. Wants to prevent falls. Larey SeatFell one more time in March 2021 when her dogs (jack russel and beagle; pt was walking them at the same time). Pt was on a road with little  traffic but it has hills. Pt did not raise her R foot high enough and tripped on a step when pt fell in February 2021. Wonders if her R foot if shorter. When pt fell in August 2020, pt states that she tripped every time. Pt now able to catch herself when she trips. Kneels on her L knee to stand up from a fall. Pt reports tripping on R foot more than the L.  [place] in [place]. Pt currently living in [place]. Planning [place] returning to [place] in September 2021. [place]: pt lives alone in a 1 story home. 3 steps front door with B rail, 4 steps back door, B rails. [place] home: 1 story home, 1 small step to enter, no rails (not a problem per pt). Same for back door.    Patient Stated Goals Be better able to perform floor to stand transfers.    Currently in Pain? No/denies    Pain Score 0-No pain    Pain Onset More than a month ago                                     PT Education -  04/28/20 1145    Education Details ther-ex    Person(s) Educated Patient    Methods Explanation;Demonstration;Tactile cues;Verbal cues    Comprehension Returned demonstration;Verbalized understanding              MedbridgeAccess Code [email or web address]  Therapeutic exercise  SLS with UE assist PRN  L 10x10 seconds  R 10x10 seconds  Single leg stars with one UE assist   R 2x2  L 2x2  Standing hip abduction with B UE assist, yellow band around ankles to promote glute med muscle strengthening.              R 10x3             L 10x3  Static lunges with one UE assist              R 10x             L 10x  Lateral step downs with B UE assist              R 5x             L 5x   Improved exercise technique, movement at target joints, use of target muscles after min to mod verbal, visual, tactile cues.     Clinical impression/response to treatment Pt arrived late so session was adjusted accordingly. Continued working on improving quadriceps  and glute med strength to promote ability to ascend and descend stairs while carrying a load without UE assist with less difficulty. Pt tolerated session well without complain of pain or loss of balance. Pt will benefit from continued skilled physical therapy services to improve strength, balance, and function.     PT Short Term Goals - 12/14/19 1458      PT SHORT TERM GOAL #1   Title Pt will be independent with her HEP to improve balance and decrease fall risk.    Baseline Pt performing her HEP (12/14/2019)    Time 3    Period Weeks    Status Achieved    Target Date 12/17/19             PT Long Term Goals - 04/20/20 1243      PT LONG TERM GOAL #1   Title Patient will improve bilateral hip extension and abduction strength to improve steadiness with gait, and standing tasks, and decrease fall risk.    Time 8    Period Weeks    Status Achieved      PT LONG TERM GOAL #2   Title Patient will improve her DGI balance score to 19/24 or more as a demonstration of improved balance.    Baseline 13/24 DGI (11/02/2019); 19/24 (12/07/2019)    Time 8    Period Weeks    Status Achieved      PT LONG TERM GOAL #3   Title Patient will have a decrease in low back pain to 4/10 or less at worst to promote ability to peform standing tasks such as washing dishes or sweeping the floor more comfortably    Baseline 7/10 low back pain at most for the past 3 months (11/02/2019); 2/10 low back pain at most for the past 7 days (12/01/2019), (12/14/2019)    Time 8    Period Weeks    Status Achieved      PT LONG TERM GOAL #4   Title Pt will be able to step over a shoe box and clear both feet independently at least 10x each LE  to improve balance and decrease fall risk.    Baseline Unable to clear shoe box when stepping over it (11/02/2019).Able to clear shoe box about 65% of the time. (12/31/2019), (01/28/2020); 60% of the time for L LE, 95 % of time foot clearance R LE (03/23/2020), (04/20/2020)    Time 4    Period  Weeks    Status On-going    Target Date 05/19/20      PT LONG TERM GOAL #5   Title Pt will report decreased to no difficulty with stair negotiation with one UE assist (4 regular steps) to promote ability to get into and out of her house more easily.    Baseline Difficulty with stair negotiation secondary to fear of falling (11/02/2019); difficulty secondary to fear of falling. No difficulty wiht B UE assist. Difficulty when carrying something without UE assist (12/01/2019); Difficulty when performing without UE assist. Able to perform with R rail assist (01/28/2020)    Time 5    Period Weeks    Status Achieved      PT LONG TERM GOAL #6   Title Pt will report decreased to no difficulty with stair negotiation WITHOUT UE assist (4 regular steps) to promote ability to get into and out of her house more easily.    Baseline Able to ascend and desecent 4 regular stairs 3x without UE assist. Pt states decreased difficulty wiht stair negotiation (03/23/2020)    Time 6    Period Weeks    Status Achieved                 Plan - 04/28/20 1157    Clinical Impression Statement Pt arrived late so session was adjusted accordingly. Continued working on improving quadriceps and glute med strength to promote ability to ascend and descend stairs while carrying a load without UE assist with less difficulty. Pt tolerated session well without complain of pain or loss of balance. Pt will benefit from continued skilled physical therapy services to improve strength, balance, and function.    Personal Factors and Comorbidities Age;Comorbidity 3+;Fitness;Past/Current Experience;Time since onset of injury/illness/exacerbation    Comorbidities B total shoulder, B total knee replacements, low back pain with radiating symptoms, L knee pain, neck pain    Examination-Activity Limitations Lift;Stairs;Carry    Stability/Clinical Decision Making Stable/Uncomplicated    Rehab Potential Fair    PT Frequency 2x / week    PT  Duration 4 weeks    PT Treatment/Interventions Therapeutic activities;Gait training;Stair training;Functional mobility training;Therapeutic exercise;Balance training;Neuromuscular re-education;Patient/family education;Manual techniques;Dry needling;Aquatic Therapy;Canalith Repostioning;Electrical Stimulation;Iontophoresis 4mg /ml Dexamethasone;Vestibular    PT Next Visit Plan posture, scapular strengthening, thoracic extension, trunk, glute strengthening, femoral control, balance, manual techniques, modalities PRN    PT Home Exercise Plan Medbridge Access Code    Consulted and Agree with Plan of Care Patient           Patient will benefit from skilled therapeutic intervention in order to improve the following deficits and impairments:  Pain, Postural dysfunction, Improper body mechanics, Difficulty walking, Decreased strength, Decreased balance, Abnormal gait  Visit Diagnosis: Unsteadiness on feet  Difficulty in walking, not elsewhere classified  Muscle weakness (generalized)  History of falling     Problem List Patient Active Problem List   Diagnosis Date Noted  . Influenza A 08/15/2016    10/13/2016 PT, DPT   04/28/2020, 12:03 PM  [facility] [address], [place] Phone: [phone]   Fax:  [phone]  Name: Tara Moody MRN: [id number] Date of Birth: [birth year]

## 2020-05-03 ENCOUNTER — Ambulatory Visit: Payer: Medicare Other

## 2020-05-03 ENCOUNTER — Other Ambulatory Visit: Payer: Self-pay

## 2020-05-03 DIAGNOSIS — R2681 Unsteadiness on feet: Secondary | ICD-10-CM | POA: Diagnosis not present

## 2020-05-03 DIAGNOSIS — M6281 Muscle weakness (generalized): Secondary | ICD-10-CM

## 2020-05-03 DIAGNOSIS — R262 Difficulty in walking, not elsewhere classified: Secondary | ICD-10-CM

## 2020-05-03 DIAGNOSIS — Z9181 History of falling: Secondary | ICD-10-CM

## 2020-05-03 NOTE — Therapy (Signed)
[place] [facility] MEDICINE [address] [place], [address] Phone: [phone]   Fax:  [phone]  Physical Therapy Treatment  Patient Details  Name: Tara Moody MRN: [id number] Date of Birth: 1946-05-13 Referring Provider (PT): Dierdre Harness, MD   Encounter Date: 05/03/2020   PT End of Session - 05/03/20 1350    Visit Number 36    Number of Visits 47    Date for PT Re-Evaluation 05/19/20    Authorization Type 6    Authorization Time Period of 10 progress report    PT Start Time 1350    PT Stop Time 1430    PT Time Calculation (min) 40 min    Equipment Utilized During Treatment Gait belt    Activity Tolerance Patient tolerated treatment well    Behavior During Therapy [facility] for tasks assessed/performed           Past Medical History:  Diagnosis Date  . Hypertension     Past Surgical History:  Procedure Laterality Date  . CATARACT EXTRACTION    . REPLACEMENT TOTAL KNEE BILATERAL      There were no vitals filed for this visit.   Subjective Assessment - 05/03/20 1351    Subjective Doing good.    Pertinent History Gait abnormality. Pt has had B knee, B shoulder replacements, B carpal tunnel surgeries. Pt hurt her L knee 2001. 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 Husband had a TBI and pt had take care of him until his death. Has had bouts of her L knee hurting. In the last 3 years, pt has been falling more. Fell 5 times in August 2020. Was outside walking the dogs and has had a hard time picking up her feet (has had that). Feels more tired since having COVID.  Has not fallen in the past 2 months because she has been paying attention. Fell up the steps which knocked the wind out of her in 09/2019.  Had COVID end of August/beginning September 2020. Wants to prevent falls. Larey Seat one more time in March 2021 when her dogs (jack russel and beagle; pt was walking them at the same time). Pt was on a road with little  traffic but it has hills. Pt did not raise her R foot high enough and tripped on a step when pt fell in February 2021. Wonders if her R foot if shorter. When pt fell in August 2020, pt states that she tripped every time. Pt now able to catch herself when she trips. Kneels on her L knee to stand up from a fall. Pt reports tripping on R foot more than the L.  Pt has a house in [place]. Pt currently living in [place]. Planning on returning to [place] in September 2021. [place]: pt lives alone in a 1 story home. 3 steps front door with B rail, 4 steps back door, B rails. [place] home: 1 story home, 1 small step to enter, no rails (not a problem per pt). Same for back door.    Patient Stated Goals Be better able to perform floor to stand transfers.    Currently in Pain? No/denies    Pain Score 0-No pain    Pain Onset More than a month ago                                     PT Education -  05/03/20 1541    Education Details ther-ex    Person(s) Educated Patient    Methods Explanation;Demonstration;Tactile cues;Verbal cues    Comprehension Returned demonstration;Verbalized understanding            MedbridgeAccess Code [email or web address]  Therapeutic exercise  SLS with UE assist PRN  L 10x10 seconds  R 10x10 seconds  Single leg stars with one UE assist              R 4x             L 3x   Ascending and descending 4 regular steps with 2 lbs each hand   3x, rail assist PRN. Able to ascend stairs multiple times without UE assist but needed rail assist descending steps secondary to lateral lean   Standing hip abduction with B UE assist, yellow band around anklesto promote glute med muscle strengthening. R 10x3 L 10x3  Static lunges with one UE assist  R 10x L 10x  Stepping over shoe box   R 10x  L 10x. Improved ability to step over with L LE and clear obstacle with trailing LE  Able to  clear obstacle 50% of the time B LE.    Lateral step downs with B UE assist  R 5x2 L 5x2  Side stepping with yellow band around distal thighs 32 ft to the R and 32 ft to the L for 2 sets  Upgrade to red band next visit.       Improved exercise technique, movement at target joints, use of target muscles after min to mod verbal, visual, tactile cues.     Clinical impression/response to treatment Continued working on improving glute and quad strength to promote ability to negotiate stairs and obstacles with less difficulty. No complain of pain throughout session. Demonstrates lateral lean when descending stairs while carrying a load, needing rail assist at times. Also demonstrates difficulty with consistency of clearing obstacle secondary to lateral lean. Pt will benefit from continued skilled physical therapy services to improve strength, balance, and function.         PT Short Term Goals - 12/14/19 1458      PT SHORT TERM GOAL #1   Title Pt will be independent with her HEP to improve balance and decrease fall risk.    Baseline Pt performing her HEP (12/14/2019)    Time 3    Period Weeks    Status Achieved    Target Date 12/17/19             PT Long Term Goals - 04/20/20 1243      PT LONG TERM GOAL #1   Title Patient will improve bilateral hip extension and abduction strength to improve steadiness with gait, and standing tasks, and decrease fall risk.    Time 8    Period Weeks    Status Achieved      PT LONG TERM GOAL #2   Title Patient will improve her DGI balance score to 19/24 or more as a demonstration of improved balance.    Baseline 13/24 DGI (11/02/2019); 19/24 (12/07/2019)    Time 8    Period Weeks    Status Achieved      PT LONG TERM GOAL #3   Title Patient will have a decrease in low back pain to 4/10 or less at worst to promote ability to peform standing tasks such as washing dishes or sweeping the floor more comfortably     Baseline 7/10 low back pain at  most for the past 3 months (11/02/2019); 2/10 low back pain at most for the past 7 days (12/01/2019), (12/14/2019)    Time 8    Period Weeks    Status Achieved      PT LONG TERM GOAL #4   Title Pt will be able to step over a shoe box and clear both feet independently at least 10x each LE to improve balance and decrease fall risk.    Baseline Unable to clear shoe box when stepping over it (11/02/2019).Able to clear shoe box about 65% of the time. (12/31/2019), (01/28/2020); 60% of the time for L LE, 95 % of time foot clearance R LE (03/23/2020), (04/20/2020)    Time 4    Period Weeks    Status On-going    Target Date 05/19/20      PT LONG TERM GOAL #5   Title Pt will report decreased to no difficulty with stair negotiation with one UE assist (4 regular steps) to promote ability to get into and out of her house more easily.    Baseline Difficulty with stair negotiation secondary to fear of falling (11/02/2019); difficulty secondary to fear of falling. No difficulty wiht B UE assist. Difficulty when carrying something without UE assist (12/01/2019); Difficulty when performing without UE assist. Able to perform with R rail assist (01/28/2020)    Time 5    Period Weeks    Status Achieved      PT LONG TERM GOAL #6   Title Pt will report decreased to no difficulty with stair negotiation WITHOUT UE assist (4 regular steps) to promote ability to get into and out of her house more easily.    Baseline Able to ascend and desecent 4 regular stairs 3x without UE assist. Pt states decreased difficulty wiht stair negotiation (03/23/2020)    Time 6    Period Weeks    Status Achieved                 Plan - 05/03/20 1421    Clinical Impression Statement Continued working on improving glute and quad strength to promote ability to negotiate stairs and obstacles with less difficulty. No complain of pain throughout session. Demonstrates lateral lean when descending stairs while  carrying a load, needing rail assist at times. Also demonstrates difficulty with consistency of clearing obstacle secondary to lateral lean. Pt will benefit from continued skilled physical therapy services to improve strength, balance, and function.    Personal Factors and Comorbidities Age;Comorbidity 3+;Fitness;Past/Current Experience;Time since onset of injury/illness/exacerbation    Comorbidities B total shoulder, B total knee replacements, low back pain with radiating symptoms, L knee pain, neck pain    Examination-Activity Limitations Lift;Stairs;Carry    Stability/Clinical Decision Making Stable/Uncomplicated    Rehab Potential Fair    PT Frequency 2x / week    PT Duration 4 weeks    PT Treatment/Interventions Therapeutic activities;Gait training;Stair training;Functional mobility training;Therapeutic exercise;Balance training;Neuromuscular re-education;Patient/family education;Manual techniques;Dry needling;Aquatic Therapy;Canalith Repostioning;Electrical Stimulation;Iontophoresis 4mg /ml Dexamethasone;Vestibular    PT Next Visit Plan posture, scapular strengthening, thoracic extension, trunk, glute strengthening, femoral control, balance, manual techniques, modalities PRN    PT Home Exercise Plan Medbridge Access Code    Consulted and Agree with Plan of Care Patient           Patient will benefit from skilled therapeutic intervention in order to improve the following deficits and impairments:  Pain, Postural dysfunction, Improper body mechanics, Difficulty walking, Decreased strength, Decreased balance, Abnormal gait  Visit Diagnosis: Unsteadiness  on feet  Difficulty in walking, not elsewhere classified  Muscle weakness (generalized)  History of falling     Problem List Patient Active Problem List   Diagnosis Date Noted  . Influenza A 08/15/2016    Loralyn FreshwaterMiguel Chanese Hartsough PT, DPT  05/03/2020, 3:46 PM  [facility]  [facility]ICINE [address] [place], [place], [address] Phone: [phone]   Fax:  [phone]  Name: Elmon ElseBarbara Moody MRN: [id number] Date of Birth: Jun 19, 1946

## 2020-05-05 ENCOUNTER — Other Ambulatory Visit: Payer: Self-pay

## 2020-05-05 ENCOUNTER — Ambulatory Visit: Payer: Medicare Other

## 2020-05-05 DIAGNOSIS — M6281 Muscle weakness (generalized): Secondary | ICD-10-CM

## 2020-05-05 DIAGNOSIS — R2681 Unsteadiness on feet: Secondary | ICD-10-CM

## 2020-05-05 DIAGNOSIS — R262 Difficulty in walking, not elsewhere classified: Secondary | ICD-10-CM

## 2020-05-05 DIAGNOSIS — Z9181 History of falling: Secondary | ICD-10-CM

## 2020-05-05 NOTE — Therapy (Signed)
[facility] [address] [place], [place], [address] Phone: [phone]   Fax:  [phone]  Physical Therapy Treatment  Patient Details  Name: Tara ElseBarbara Moody MRN: [id number] Date of Birth: [birth year] Referring Provider (PT): Dierdre Harnessavid Patrick Fitzgerald, MD   Encounter Date: 05/05/2020   PT End of Session - 05/05/20 1125    Visit Number 37    Number of Visits 47    Date for PT Re-Evaluation 05/19/20    Authorization Type 7    Authorization Time Period of 10 progress report    PT Start Time 1125   Pt arrived late   PT Stop Time 1155    PT Time Calculation (min) 30 min    Equipment Utilized During Treatment Gait belt    Activity Tolerance Patient tolerated treatment well    Behavior During Therapy [facility] for tasks assessed/performed           Past Medical History:  Diagnosis Date  . Hypertension     Past Surgical History:  Procedure Laterality Date  . CATARACT EXTRACTION    . REPLACEMENT TOTAL KNEE BILATERAL      There were no vitals filed for this visit.   Subjective Assessment - 05/05/20 1126    Subjective Got her shoe inserts. No pain or discomfort. Not wearing her hearing aids.    Pertinent History Gait abnormality. Pt has had B knee, B shoulder replacements, B carpal tunnel surgeries. Pt hurt her L knee 2001. 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 Husband had a TBI and pt had take care of him until his death. Has had bouts of her L knee hurting. In the last 3 years, pt has been falling more. Fell 5 times in August 2020. Was outside walking the dogs and has had a hard time picking up her feet (has had that). Feels more tired since having COVID.  Has not fallen in the past 2 months because she has been paying attention. Fell up the steps which knocked the wind out of her in 09/2019.  Had COVID end of August/beginning September 2020. Wants to prevent falls. Larey SeatFell one more time in March 2021 when her dogs (jack russel and  beagle; pt was walking them at the same time). Pt was on a road with little traffic but it has hills. Pt did not raise her R foot high enough and tripped on a step when pt fell in February 2021. Wonders if her R foot if shorter. When pt fell in August 2020, pt states that she tripped every time. Pt now able to catch herself when she trips. Kneels on her L knee to stand up from a fall. Pt reports tripping on R foot more than the L.  [place] in [place]. Pt currently living in [place]. Planning [place] returning to [place] in September 2021. [place]: pt lives alone in a 1 story home. 3 steps front door with B rail, 4 steps back door, B rails. [place] home: 1 story home, 1 small step to enter, no rails (not a problem per pt). Same for back door.    Patient Stated Goals Be better able to perform floor to stand transfers.    Currently in Pain? No/denies    Pain Onset More than a month ago  PT Education - 05/05/20 1147    Education Details ther-ex    Person(s) Educated Patient    Methods Explanation;Demonstration;Tactile cues;Verbal cues    Comprehension Returned demonstration;Verbalized understanding          MedbridgeAccess Code [email or web address]  Therapeutic exercise  Pt wearing arch supports  Restroom break.    SLS with UE assist PRN  L 10x 5-10 seconds  R 10x 5- 10 seconds   Stepping over shoe box              R 10x             L 10x. Improved ability to step over with L LE and clear obstacle with trailing LE             Able to clear obstacle 70% of the time B LE.     Improved foot stability observed with addition of arch supports   Ascending and descending 4 regular steps with 2 lbs each hand              3x, rail assist PRN. Able to ascend stairs multiple times without UE assist.    Only needed rail assist 1x descending steps today secondary to lateral lean    Standing hip abduction  with B UE assist, yellow band around anklesto promote glute med muscle strengthening. R 10x3 L 10x3  Good muscle use felt reported by pt.    Single leg stars with one UE assist  R 4x L 4x    Lateral step downs with B UE assist  R 5x L 5x   Improved exercise technique, movement at target joints, use of target muscles after min to mod verbal, visual, tactile cues.     Clinical impression/response to treatment Pt arrived late so session was adjusted accordingly. Improved balance with stepping over shoe box with addition of arch supports to assist in ankle stability in closed chain. Continued working on glute and quad strengthening to promote single leg stance strength and balance for obstacle and stair negotiation. Pt tolerated session well without aggravation of symptoms. Pt will benefit from continued skilled physical therapy services to improve strength, balance, and function.         PT Short Term Goals - 12/14/19 1458      PT SHORT TERM GOAL #1   Title Pt will be independent with her HEP to improve balance and decrease fall risk.    Baseline Pt performing her HEP (12/14/2019)    Time 3    Period Weeks    Status Achieved    Target Date 12/17/19             PT Long Term Goals - 04/20/20 1243      PT LONG TERM GOAL #1   Title Patient will improve bilateral hip extension and abduction strength to improve steadiness with gait, and standing tasks, and decrease fall risk.    Time 8    Period Weeks    Status Achieved      PT LONG TERM GOAL #2   Title Patient will improve her DGI balance score to 19/24 or more as a demonstration of improved balance.    Baseline 13/24 DGI (11/02/2019); 19/24 (12/07/2019)    Time 8    Period Weeks    Status Achieved      PT LONG TERM GOAL #3   Title Patient will have a decrease in low back pain to 4/10 or less at worst to promote ability to peform  standing tasks such as washing dishes or sweeping the floor more comfortably    Baseline 7/10 low back pain at most for the past 3 months (11/02/2019); 2/10 low back pain at most for the past 7 days (12/01/2019), (12/14/2019)    Time 8    Period Weeks    Status Achieved      PT LONG TERM GOAL #4   Title Pt will be able to step over a shoe box and clear both feet independently at least 10x each LE to improve balance and decrease fall risk.    Baseline Unable to clear shoe box when stepping over it (11/02/2019).Able to clear shoe box about 65% of the time. (12/31/2019), (01/28/2020); 60% of the time for L LE, 95 % of time foot clearance R LE (03/23/2020), (04/20/2020)    Time 4    Period Weeks    Status On-going    Target Date 05/19/20      PT LONG TERM GOAL #5   Title Pt will report decreased to no difficulty with stair negotiation with one UE assist (4 regular steps) to promote ability to get into and out of her house more easily.    Baseline Difficulty with stair negotiation secondary to fear of falling (11/02/2019); difficulty secondary to fear of falling. No difficulty wiht B UE assist. Difficulty when carrying something without UE assist (12/01/2019); Difficulty when performing without UE assist. Able to perform with R rail assist (01/28/2020)    Time 5    Period Weeks    Status Achieved      PT LONG TERM GOAL #6   Title Pt will report decreased to no difficulty with stair negotiation WITHOUT UE assist (4 regular steps) to promote ability to get into and out of her house more easily.    Baseline Able to ascend and desecent 4 regular stairs 3x without UE assist. Pt states decreased difficulty wiht stair negotiation (03/23/2020)    Time 6    Period Weeks    Status Achieved                 Plan - 05/05/20 1147    Clinical Impression Statement Pt arrived late so session was adjusted accordingly. Improved balance with stepping over shoe box with addition of arch supports to assist in ankle  stability in closed chain. Continued working on glute and quad strengthening to promote single leg stance strength and balance for obstacle and stair negotiation. Pt tolerated session well without aggravation of symptoms. Pt will benefit from continued skilled physical therapy services to improve strength, balance, and function.    Personal Factors and Comorbidities Age;Comorbidity 3+;Fitness;Past/Current Experience;Time since onset of injury/illness/exacerbation    Comorbidities B total shoulder, B total knee replacements, low back pain with radiating symptoms, L knee pain, neck pain    Examination-Activity Limitations Lift;Stairs;Carry    Stability/Clinical Decision Making Stable/Uncomplicated    Rehab Potential Fair    PT Frequency 2x / week    PT Duration 4 weeks    PT Treatment/Interventions Therapeutic activities;Gait training;Stair training;Functional mobility training;Therapeutic exercise;Balance training;Neuromuscular re-education;Patient/family education;Manual techniques;Dry needling;Aquatic Therapy;Canalith Repostioning;Electrical Stimulation;Iontophoresis 4mg /ml Dexamethasone;Vestibular    PT Next Visit Plan posture, scapular strengthening, thoracic extension, trunk, glute strengthening, femoral control, balance, manual techniques, modalities PRN    PT Home Exercise Plan Medbridge Access Code    Consulted and Agree with Plan of Care Patient           Patient will benefit from skilled therapeutic intervention in order to  improve the following deficits and impairments:  Pain, Postural dysfunction, Improper body mechanics, Difficulty walking, Decreased strength, Decreased balance, Abnormal gait  Visit Diagnosis: Unsteadiness on feet  Difficulty in walking, not elsewhere classified  Muscle weakness (generalized)  History of falling     Problem List Patient Active Problem List   Diagnosis Date Noted  . Influenza A 08/15/2016   Loralyn Freshwater PT, DPT   05/05/2020,  12:09 PM  [place] [facility] PHYSICAL AND SPORTS MEDICINE [address], [place], [address] Phone: [phone]   Fax:  [phone]  Name: Tara Moody MRN: [id number] Date of Birth: Apr 17, 1946

## 2020-05-11 ENCOUNTER — Other Ambulatory Visit: Payer: Self-pay

## 2020-05-11 ENCOUNTER — Ambulatory Visit: Payer: Medicare Other | Attending: Infectious Diseases

## 2020-05-11 DIAGNOSIS — Z9181 History of falling: Secondary | ICD-10-CM

## 2020-05-11 DIAGNOSIS — M6281 Muscle weakness (generalized): Secondary | ICD-10-CM | POA: Diagnosis present

## 2020-05-11 DIAGNOSIS — R2681 Unsteadiness on feet: Secondary | ICD-10-CM | POA: Diagnosis present

## 2020-05-11 DIAGNOSIS — R262 Difficulty in walking, not elsewhere classified: Secondary | ICD-10-CM

## 2020-05-11 NOTE — Therapy (Addendum)
[facility] [address] [place], [address] Phone: [phone]   Fax:  [phone]  Physical Therapy Treatment And Discharge Summary   Patient Details  Name: Tara Moody MRN: [id number] Date of Birth: Nov 09, 1945 Referring Provider (PT): Angelena Form, MD   Encounter Date: 05/11/2020   PT End of Session - 05/11/20 1524    Visit Number 38    Number of Visits 47    Date for PT Re-Evaluation 05/19/20    Authorization Type 8    Authorization Time Period of 10 progress report    PT Start Time 1524    PT Stop Time 9163    PT Time Calculation (min) 20 min    Equipment Utilized During Treatment Gait belt    Activity Tolerance Patient tolerated treatment well    Behavior During Therapy WFL for tasks assessed/performed           Past Medical History:  Diagnosis Date  . Hypertension     Past Surgical History:  Procedure Laterality Date  . CATARACT EXTRACTION    . REPLACEMENT TOTAL KNEE BILATERAL      There were no vitals filed for this visit.   Subjective Assessment - 05/11/20 1526    Subjective Doing good. The arch supports are helping. Just a little sore on both hips. Balance is good. Doing her HEP. Feels more confident.    Pertinent History Gait abnormality. Pt has had B knee, B shoulder replacements, B carpal tunnel surgeries. Pt hurt her L knee 2001. 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 Husband had a TBI and pt had take care of him until his death. Has had bouts of her L knee hurting. In the last 3 years, pt has been falling more. Fell 5 times in August 2020. Was outside walking the dogs and has had a hard time picking up her feet (has had that). Feels more tired since having COVID.  Has not fallen in the past 2 months because she has been paying attention. Fell up the steps which knocked the wind out of her in 09/2019.  Had COVID end of August/beginning September 2020. Wants to prevent falls.  [address] one more time in March 2021 when her dogs (jack russel and beagle; pt was walking them at the same time). Pt was on a road with little traffic but it has hills. Pt did not raise her R foot high enough and tripped on a step when pt fell in February 2021. Wonders if her R foot if shorter. When pt fell in August 2020, pt states that she tripped every time. Pt now able to catch herself when she trips. Kneels on her L knee to stand up from a fall. Pt reports tripping on R foot more than the L.  Pt has a house in [place]. Pt currently living in [place]. Planning on returning to [place] in September 2021. [place]: pt lives alone in a 1 story home. 3 steps front door with B rail, 4 steps back door, B rails. [place] home: 1 story home, 1 small step to enter, no rails (not a problem per pt). Same for back door.    Patient Stated Goals Be better able to perform floor to stand transfers.    Currently in Pain? No/denies    Pain Onset More than a month ago  PT Education - 05/11/20 1530    Education Details ther-ex    Person(s) Educated Patient    Methods Explanation;Demonstration;Tactile cues;Verbal cues    Comprehension Returned demonstration;Verbalized understanding             MedbridgeAccess Code [email or web address]  Therapeutic exercise  Pt wearing arch supports  Stepping over shoe box  R 10x2 L 10x.2  Able to clear shoe box about 85% - 90% of the time L LE and 95% of the time for R LE.   Seated trunk flexion stretch 10x5 seconds for 2 sets   Decreased B lateral hip pain  Standing glute max squeeze 10x10 seconds for 2 sets     Improved exercise technique, movement at target joints, use of target muscles after min to mod verbal, visual, tactile cues.     Clinical impression/response to treatment Pt demonstrates overall improved bilateral hip strength, balance score, decreased back pain,  ability to clear obstacles, negotiate stairs without UE assist as well as ambulate on uneven surfaces without LOB. Pt has made very good progress with PT towards goals and also demonstrates consistency and independence with her HEP. Skilled physical therapy services discharged with patient continuing her progress with her exercises at home.         PT Short Term Goals - 12/14/19 1458      PT SHORT TERM GOAL #1   Title Pt will be independent with her HEP to improve balance and decrease fall risk.    Baseline Pt performing her HEP (12/14/2019)    Time 3    Period Weeks    Status Achieved    Target Date 12/17/19             PT Long Term Goals - 05/11/20 1559      PT LONG TERM GOAL #1   Title Patient will improve bilateral hip extension and abduction strength to improve steadiness with gait, and standing tasks, and decrease fall risk.    Time 8    Period Weeks    Status Achieved      PT LONG TERM GOAL #2   Title Patient will improve her DGI balance score to 19/24 or more as a demonstration of improved balance.    Baseline 13/24 DGI (11/02/2019); 19/24 (12/07/2019)    Time 8    Period Weeks    Status Achieved      PT LONG TERM GOAL #3   Title Patient will have a decrease in low back pain to 4/10 or less at worst to promote ability to peform standing tasks such as washing dishes or sweeping the floor more comfortably    Baseline 7/10 low back pain at most for the past 3 months (11/02/2019); 2/10 low back pain at most for the past 7 days (12/01/2019), (12/14/2019)    Time 8    Period Weeks    Status Achieved      PT LONG TERM GOAL #4   Title Pt will be able to step over a shoe box and clear both feet independently at least 10x each LE to improve balance and decrease fall risk.    Baseline Unable to clear shoe box when stepping over it (11/02/2019).Able to clear shoe box about 65% of the time. (12/31/2019), (01/28/2020); 60% of the time for L LE, 95 % of time foot clearance R LE  (03/23/2020), (04/20/2020); Able to clear shoe box 85% - 90 % of the time L LE, and 95% for R LE (05/11/2020)  Time 4    Period Weeks    Status Partially Met    Target Date 05/19/20      PT LONG TERM GOAL #5   Title Pt will report decreased to no difficulty with stair negotiation with one UE assist (4 regular steps) to promote ability to get into and out of her house more easily.    Baseline Difficulty with stair negotiation secondary to fear of falling (11/02/2019); difficulty secondary to fear of falling. No difficulty wiht B UE assist. Difficulty when carrying something without UE assist (12/01/2019); Difficulty when performing without UE assist. Able to perform with R rail assist (01/28/2020)    Time 5    Period Weeks    Status Achieved      PT LONG TERM GOAL #6   Title Pt will report decreased to no difficulty with stair negotiation WITHOUT UE assist (4 regular steps) to promote ability to get into and out of her house more easily.    Baseline Able to ascend and desecent 4 regular stairs 3x without UE assist. Pt states decreased difficulty wiht stair negotiation (03/23/2020)    Time 6    Period Weeks    Status Achieved                 Plan - 05/11/20 1525    Clinical Impression Statement Pt demonstrates overall improved bilateral hip strength, balance score, decreased back pain, ability to clear obstacles, negotiate stairs without UE assist as well as ambulate on uneven surfaces without LOB. Pt has made very good progress with PT towards goals and also demonstrates consistency and independence with her HEP. Skilled physical therapy services discharged with patient continuing her progress with her exercises at home.    Personal Factors and Comorbidities Age;Comorbidity 3+;Fitness;Past/Current Experience;Time since onset of injury/illness/exacerbation    Comorbidities B total shoulder, B total knee replacements, low back pain with radiating symptoms, L knee pain, neck pain     Examination-Activity Limitations Lift;Stairs;Carry    Stability/Clinical Decision Making Stable/Uncomplicated    Clinical Decision Making Low    Rehab Potential Fair    PT Frequency --    PT Duration --    PT Treatment/Interventions Therapeutic activities;Gait training;Stair training;Functional mobility training;Therapeutic exercise;Balance training;Neuromuscular re-education;Patient/family education;Manual techniques    PT Next Visit Plan continue progress with her [place] Access Code [email or web address]    Consulted and Agree with Plan of Care Patient           Patient will benefit from skilled therapeutic intervention in order to improve the following deficits and impairments:  Pain, Postural dysfunction, Improper body mechanics, Difficulty walking, Decreased strength, Decreased balance, Abnormal gait  Visit Diagnosis: Unsteadiness on feet  Difficulty in walking, not elsewhere classified  Muscle weakness (generalized)  History of falling     Problem List Patient Active Problem List   Diagnosis Date Noted  . Influenza A 08/15/2016     Thank you for your referral.   Joneen Boers PT, DPT   05/11/2020, 4:06 PM  [facility] [address] [place], [address] Phone: [phone]   Fax:  [phone]  Name: Tara Moody MRN: [id number] Date of Birth: 1945-12-29

## 2023-11-19 ENCOUNTER — Emergency Department
Admission: EM | Admit: 2023-11-19 | Discharge: 2023-11-19 | Disposition: A | Discharge status: Home / Self Care | Attending: Emergency Medicine | Admitting: Emergency Medicine

## 2023-11-19 ENCOUNTER — Emergency Department

## 2023-11-19 ENCOUNTER — Encounter: Payer: Self-pay | Admitting: Intensive Care

## 2023-11-19 ENCOUNTER — Other Ambulatory Visit: Payer: Self-pay

## 2023-11-19 DIAGNOSIS — I10 Essential (primary) hypertension: Secondary | ICD-10-CM | POA: Diagnosis not present

## 2023-11-19 DIAGNOSIS — R531 Weakness: Secondary | ICD-10-CM | POA: Insufficient documentation

## 2023-11-19 DIAGNOSIS — R059 Cough, unspecified: Secondary | ICD-10-CM | POA: Diagnosis not present

## 2023-11-19 DIAGNOSIS — R5383 Other fatigue: Secondary | ICD-10-CM | POA: Insufficient documentation

## 2023-11-19 LAB — CBC
HCT: 35.7 % — ABNORMAL LOW (ref 36.0–46.0)
Hemoglobin: 11.9 g/dL — ABNORMAL LOW (ref 12.0–15.0)
MCH: 28.2 pg (ref 26.0–34.0)
MCHC: 33.3 g/dL (ref 30.0–36.0)
MCV: 84.6 fL (ref 80.0–100.0)
Platelets: 233 10*3/uL (ref 150–400)
RBC: 4.22 MIL/uL (ref 3.87–5.11)
RDW: 13.2 % (ref 11.5–15.5)
WBC: 7.8 10*3/uL (ref 4.0–10.5)
nRBC: 0 % (ref 0.0–0.2)

## 2023-11-19 LAB — BASIC METABOLIC PANEL WITH GFR
Anion gap: 10 (ref 5–15)
BUN: 29 mg/dL — ABNORMAL HIGH (ref 8–23)
CO2: 25 mmol/L (ref 22–32)
Calcium: 9.3 mg/dL (ref 8.9–10.3)
Chloride: 104 mmol/L (ref 98–111)
Creatinine, Ser: 0.76 mg/dL (ref 0.44–1.00)
GFR, Estimated: >60 mL/min (ref >60)
Glucose, Bld: 102 mg/dL — ABNORMAL HIGH (ref 70–99)
Potassium: 3.6 mmol/L (ref 3.5–5.1)
Sodium: 139 mmol/L (ref 135–145)

## 2023-11-19 LAB — RESP PANEL BY RT-PCR (RSV, FLU A&B, COVID)  RVPGX2
Influenza A by PCR: NEGATIVE
Influenza B by PCR: NEGATIVE
Resp Syncytial Virus by PCR: NEGATIVE
SARS Coronavirus 2 by RT PCR: NEGATIVE

## 2023-11-19 LAB — TROPONIN I (HIGH SENSITIVITY): Troponin I (High Sensitivity): 12 ng/L (ref <18)

## 2023-11-19 MED ORDER — LOSARTAN POTASSIUM-HCTZ 100-25 MG PO TABS: 1.0000 | ORAL_TABLET | Freq: Every day | ORAL | 2 refills | Status: AC

## 2023-11-19 NOTE — ED Triage Notes (Signed)
 Patient brought over from [facility]. Reports cough, body aches and fatigue ongoing for four months. Also c/o sternal squeezing intermittently.   History of splenic aneurysm   Reports she has been out of her medications for months. Lives here in [place] half the year and the other half in [place]    Also reports ongoing pain in upper and lower back for months

## 2023-11-19 NOTE — ED Notes (Signed)
 Patient reports noncompliance with hypertension medication.

## 2023-11-19 NOTE — ED Provider Notes (Signed)
 [facility] Provider Note    Event Date/Time   First MD Initiated Contact with Patient 11/19/23 1702     (approximate)   History   Chief Complaint Chest Pain and Cough   HPI  Tara Moody is a 78 y.o. female with past medical history of hypertension who presents to the ED complaining of generalized weakness.  Patient reports that she has been feeling weak and fatigued for about the past year, reports a dry cough at times but has not had any fevers or difficulty breathing.  She has had generalized bodyaches at times with occasional squeezing in her chest, but denies any chest discomfort today.  She primarily lives in [place] , where her PCP is located, but spends part of the year living in [place] .  She is concerned because she has run out of her blood pressure medication, states she is still taking metoprolol but not her losartan.     Physical Exam   Triage Vital Signs: ED Triage Vitals  Encounter Vitals Group     BP 11/19/23 1627 (!) 159/92     Systolic BP Percentile --      Diastolic BP Percentile --      Pulse Rate 11/19/23 1627 88     Resp 11/19/23 1627 18     Temp 11/19/23 1627 98.6 F (37 C)     Temp Source 11/19/23 1627 Oral     SpO2 11/19/23 1627 98 %     Weight 11/19/23 1629 177 lb (80.3 kg)     Height 11/19/23 1629 5' 3.5" (1.613 m)     Head Circumference --      Peak Flow --      Pain Score 11/19/23 1628 6     Pain Loc --      Pain Education --      Exclude from Growth Chart --     Most recent vital signs: Vitals:   11/19/23 1627 11/19/23 1730  BP: (!) 159/92 (!) 157/88  Pulse: 88 87  Resp: 18 19  Temp: 98.6 F (37 C)   SpO2: 98% 100%    Constitutional: Alert and oriented. Eyes: Conjunctivae are normal. Head: Atraumatic. Nose: No congestion/rhinnorhea. Mouth/Throat: Mucous membranes are moist.  Cardiovascular: Normal rate, regular rhythm. Grossly normal heart sounds.  2+ radial pulses bilaterally. Respiratory:  Normal respiratory effort.  No retractions. Lungs CTAB. Gastrointestinal: Soft and nontender. No distention. Musculoskeletal: No lower extremity tenderness nor edema.  Neurologic:  Normal speech and language. No gross focal neurologic deficits are appreciated.    ED Results / Procedures / Treatments   Labs (all labs ordered are listed, but only abnormal results are displayed) Labs Reviewed  BASIC METABOLIC PANEL WITH GFR - Abnormal; Notable for the following components:      Result Value   Glucose, Bld 102 (*)    BUN 29 (*)    All other components within normal limits  CBC - Abnormal; Notable for the following components:   Hemoglobin 11.9 (*)    HCT 35.7 (*)    All other components within normal limits  RESP PANEL BY RT-PCR (RSV, FLU A&B, COVID)  [id number]  TROPONIN I (HIGH SENSITIVITY)  TROPONIN I (HIGH SENSITIVITY)     EKG  ED ECG REPORT I, Twilla Galea, the attending physician, personally viewed and interpreted this ECG.   Date: 11/19/2023  EKG Time: 16:29  Rate: 91  Rhythm: normal sinus rhythm  Axis: Normal  Intervals:none  ST&T Change: None  RADIOLOGY  Chest x-ray reviewed and interpreted by me with no infiltrate, edema, or effusion.  PROCEDURES:  Critical Care performed: No  Procedures   MEDICATIONS ORDERED IN ED: Medications - No data to display   IMPRESSION / MDM / ASSESSMENT AND PLAN / ED COURSE  I reviewed the triage vital signs and the nursing notes.                              78 y.o. female with past medical history of hypertension who presents to the ED complaining of generalized weakness and fatigue for the past year with some body aches and cough for the past few months.  Patient's presentation is most consistent with acute presentation with potential threat to life or bodily function.  Differential diagnosis includes, but is not limited to, uncontrolled hypertension, anemia, electrolyte abnormality, AKI, ACS, pneumonia.  Patient  nontoxic-appearing and in no acute distress, vital signs are unremarkable.  EKG shows no evidence arrhythmia or ischemia and troponin within normal limits, doubt ACS or PE.  Chest x-ray shows no evidence of pneumonia or other acute process, labs are also reassuring without significant anemia, leukocytosis, electrolyte abnormality, or AKI.  With reassuring workup, patient appropriate for discharge home with outpatient follow-up, referral provided for PCP in [place]  and we will refill her blood pressure medication.  She was counseled to return to the ED for new or worsening symptoms, patient agrees with plan.      FINAL CLINICAL IMPRESSION(S) / ED DIAGNOSES   Final diagnoses:  Generalized weakness  Other fatigue  Hypertension, unspecified type     Rx / DC Orders   ED Discharge Orders          Ordered    losartan-hydrochlorothiazide (HYZAAR) 100-25 MG tablet  Daily        11/19/23 1803             Note:  This document was prepared using Dragon voice recognition software and may include unintentional dictation errors.   Twilla Galea, MD 11/19/23 1806
